# Patient Record
Sex: Female | Born: 1985 | Race: White | Hispanic: No | Marital: Married | Smoking: Never smoker
Health system: Southern US, Community
[De-identification: ages and names within clinical notes are randomized; demographics above are authoritative.]

## PROBLEM LIST (undated history)

## (undated) ENCOUNTER — Inpatient Hospital Stay (HOSPITAL_COMMUNITY): Payer: Self-pay

## (undated) ENCOUNTER — Inpatient Hospital Stay (HOSPITAL_COMMUNITY): Payer: BLUE CROSS/BLUE SHIELD

## (undated) DIAGNOSIS — O039 Complete or unspecified spontaneous abortion without complication: Secondary | ICD-10-CM

## (undated) DIAGNOSIS — O00109 Unspecified tubal pregnancy without intrauterine pregnancy: Secondary | ICD-10-CM

## (undated) DIAGNOSIS — Z789 Other specified health status: Secondary | ICD-10-CM

## (undated) HISTORY — PX: WISDOM TOOTH EXTRACTION: SHX21

---
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
## 2010-01-05 NOTE — L&D Delivery Note (Signed)
Delivery Note At 5:29 PM a viable female was delivered via Vaginal, Spontaneous Delivery (Presentation: Right Occiput Anterior).  APGAR: 8,9 ; weight 6lbs 3oz .   Placenta status: Intact, Spontaneous.  Cord: 3 vessels with the following complications: None.  Anesthesia: Local  Episiotomy: None Lacerations: Labial Suture Repair: none Est. Blood Loss (mL): 400  Mom to postpartum.  Baby to nursery-stable.  Coleby Yett D 08/02/2010, 5:44 PM

## 2010-01-14 LAB — RPR: RPR: NONREACTIVE

## 2010-01-14 LAB — HIV ANTIBODY (ROUTINE TESTING W REFLEX): HIV: NONREACTIVE

## 2010-01-14 LAB — CBC
Hemoglobin: 13.3 g/dL (ref 12.0–16.0)
Platelets: 196 10*3/uL (ref 150–399)

## 2010-01-14 LAB — RUBELLA ANTIBODY, IGM: Rubella: IMMUNE

## 2010-07-15 LAB — STREP B DNA PROBE: GBS: NEGATIVE

## 2010-08-02 ENCOUNTER — Encounter (HOSPITAL_COMMUNITY): Payer: Self-pay | Admitting: *Deleted

## 2010-08-02 ENCOUNTER — Inpatient Hospital Stay (HOSPITAL_COMMUNITY)
Admission: AD | Admit: 2010-08-02 | Discharge: 2010-08-04 | DRG: 373 | Disposition: A | Discharge status: Home / Self Care | Payer: BC Managed Care – PPO | Source: Ambulatory Visit | Attending: Obstetrics and Gynecology | Admitting: Obstetrics and Gynecology

## 2010-08-02 DIAGNOSIS — IMO0001 Reserved for inherently not codable concepts without codable children: Secondary | ICD-10-CM

## 2010-08-02 HISTORY — DX: Other specified health status: Z78.9

## 2010-08-02 LAB — CBC
MCHC: 33.9 g/dL (ref 30.0–36.0)
Platelets: 173 10*3/uL (ref 150–400)
RDW: 13 % (ref 11.5–15.5)
WBC: 14.8 10*3/uL — ABNORMAL HIGH (ref 4.0–10.5)

## 2010-08-02 LAB — ABO/RH: ABO/RH(D): O NEG

## 2010-08-02 MED ORDER — ZOLPIDEM TARTRATE 5 MG PO TABS: 5.0000 mg | ORAL_TABLET | Freq: Every evening | ORAL | Status: DC | PRN

## 2010-08-02 MED ORDER — ONDANSETRON HCL 4 MG/2ML IJ SOLN: 4.0000 mg | Freq: Four times a day (QID) | INTRAMUSCULAR | Status: DC | PRN

## 2010-08-02 MED ORDER — OXYCODONE-ACETAMINOPHEN 5-325 MG PO TABS: 1.0000 | ORAL_TABLET | ORAL | Status: DC | PRN

## 2010-08-02 MED ORDER — LACTATED RINGERS IV SOLN
INTRAVENOUS | Status: DC
  Administered 2010-08-02: 125 mL via INTRAVENOUS
  Administered 2010-08-02: 16:00:00 via INTRAVENOUS

## 2010-08-02 MED ORDER — MEASLES, MUMPS & RUBELLA VAC ~~LOC~~ INJ: 0.5000 mL | INJECTION | Freq: Once | SUBCUTANEOUS | Status: DC

## 2010-08-02 MED ORDER — WITCH HAZEL-GLYCERIN EX PADS: MEDICATED_PAD | CUTANEOUS | Status: DC | PRN

## 2010-08-02 MED ORDER — SENNOSIDES-DOCUSATE SODIUM 8.6-50 MG PO TABS
1.0000 | ORAL_TABLET | Freq: Every day | ORAL | Status: DC
Start: 2010-08-02 — End: 2010-08-04

## 2010-08-02 MED ORDER — ONDANSETRON HCL 4 MG/2ML IJ SOLN: 4.0000 mg | INTRAMUSCULAR | Status: DC | PRN

## 2010-08-02 MED ORDER — MAGNESIUM HYDROXIDE 400 MG/5ML PO SUSP: 30.0000 mL | ORAL | Status: DC | PRN

## 2010-08-02 MED ORDER — NALBUPHINE HCL 10 MG/ML IJ SOLN
5.0000 mg | Freq: Once | INTRAMUSCULAR | Status: DC
  Administered 2010-08-02: 5 mg via SUBCUTANEOUS

## 2010-08-02 MED ORDER — NALBUPHINE SYRINGE 5 MG/0.5 ML
INJECTION | INTRAMUSCULAR | Status: AC
  Filled 2010-08-02: qty 1

## 2010-08-02 MED ORDER — SIMETHICONE 80 MG PO CHEW: 80.0000 mg | CHEWABLE_TABLET | ORAL | Status: DC | PRN

## 2010-08-02 MED ORDER — OXYCODONE-ACETAMINOPHEN 5-325 MG PO TABS: 2.0000 | ORAL_TABLET | ORAL | Status: DC | PRN

## 2010-08-02 MED ORDER — ACETAMINOPHEN 325 MG PO TABS: 650.0000 mg | ORAL_TABLET | ORAL | Status: DC | PRN

## 2010-08-02 MED ORDER — ONDANSETRON HCL 4 MG PO TABS: 4.0000 mg | ORAL_TABLET | ORAL | Status: DC | PRN

## 2010-08-02 MED ORDER — NALBUPHINE HCL 10 MG/ML IJ SOLN
5.0000 mg | Freq: Once | INTRAMUSCULAR | Status: AC
  Administered 2010-08-02: 5 mg via INTRAVENOUS

## 2010-08-02 MED ORDER — PRENATAL PLUS 27-1 MG PO TABS: 1.0000 | ORAL_TABLET | Freq: Every day | ORAL | Status: DC

## 2010-08-02 MED ORDER — BENZOCAINE-MENTHOL 20-0.5 % EX AERO: 1.0000 "application " | INHALATION_SPRAY | CUTANEOUS | Status: DC | PRN

## 2010-08-02 MED ORDER — CITRIC ACID-SODIUM CITRATE 334-500 MG/5ML PO SOLN: 30.0000 mL | ORAL | Status: DC | PRN

## 2010-08-02 MED ORDER — TETANUS-DIPHTH-ACELL PERTUSSIS 5-2.5-18.5 LF-MCG/0.5 IM SUSP
0.5000 mL | Freq: Once | INTRAMUSCULAR | Status: AC
  Administered 2010-08-03: 0.5 mL via INTRAMUSCULAR
  Filled 2010-08-02: qty 0.5

## 2010-08-02 MED ORDER — DIPHENHYDRAMINE HCL 25 MG PO CAPS: 25.0000 mg | ORAL_CAPSULE | Freq: Four times a day (QID) | ORAL | Status: DC | PRN

## 2010-08-02 MED ORDER — IBUPROFEN 600 MG PO TABS
600.0000 mg | ORAL_TABLET | Freq: Four times a day (QID) | ORAL | Status: DC | PRN
  Administered 2010-08-02: 600 mg via ORAL
  Filled 2010-08-02: qty 1

## 2010-08-02 MED ORDER — NALOXONE HCL 0.4 MG/ML IJ SOLN
INTRAMUSCULAR | Status: AC
  Filled 2010-08-02: qty 1

## 2010-08-02 MED ORDER — OXYTOCIN 20 UNITS IN LACTATED RINGERS INFUSION - SIMPLE
125.0000 mL/h | Freq: Once | INTRAVENOUS | Status: DC
  Administered 2010-08-02: 500 mL/h via INTRAVENOUS
  Filled 2010-08-02: qty 1000

## 2010-08-02 MED ORDER — LACTATED RINGERS IV SOLN
500.0000 mL | INTRAVENOUS | Status: DC | PRN
Start: 2010-08-02 — End: 2010-08-02

## 2010-08-02 MED ORDER — LIDOCAINE HCL (PF) 1 % IJ SOLN
30.0000 mL | INTRAMUSCULAR | Status: DC | PRN
  Administered 2010-08-02: 30 mL via SUBCUTANEOUS
  Filled 2010-08-02: qty 30

## 2010-08-02 MED ORDER — IBUPROFEN 600 MG PO TABS
600.0000 mg | ORAL_TABLET | Freq: Four times a day (QID) | ORAL | Status: DC
  Administered 2010-08-03 – 2010-08-04 (×6): 600 mg via ORAL
  Filled 2010-08-02 (×6): qty 1

## 2010-08-02 MED ORDER — PRENATAL PLUS 27-1 MG PO TABS
1.0000 | ORAL_TABLET | Freq: Every day | ORAL | Status: DC
  Administered 2010-08-03 – 2010-08-04 (×2): 1 via ORAL
  Filled 2010-08-02 (×2): qty 1

## 2010-08-02 NOTE — H&P (Signed)
Amy Rios is a 25 y.o. female presenting for evaluation of contractions. Maternal Medical History:  Reason for admission: Reason for admission: contractions.  Contractions: Onset was 3-5 hours ago.   Frequency: regular.   Perceived severity is strong.    Fetal activity: Perceived fetal activity is normal.    Prenatal complications: no prenatal complications   OB History    Grav Para Term Preterm Abortions TAB SAB Ect Mult Living   1              Past Medical History  Diagnosis Date  . No pertinent past medical history    Past Surgical History  Procedure Date  . No past surgeries    Family History: family history includes Cancer in her mother and Hypertension in her maternal grandmother. Social History:  reports that she has never smoked. She has never used smokeless tobacco. She reports that she does not drink alcohol or use illicit drugs.  ROS- normal complaints of pregnancy  Dilation: 10 Effacement (%): 100 Station: +1 Exam by:: Meisigner  Blood pressure 116/72, pulse 73, temperature 97.7 F (36.5 C), temperature source Oral, resp. rate 18, height 5\' 2"  (1.575 m), weight 59.693 kg (131 lb 9.6 oz). Maternal Exam:  Abdomen: Patient reports no abdominal tenderness. Fundal height is appropriate.   Estimated fetal weight is 7 lbs.   Fetal presentation: vertex  Introitus: Normal vulva. Normal vagina.  Amniotic fluid character: clear.  Pelvis: adequate for delivery.   Cervix: Cervix evaluated by digital exam.     Fetal Exam Fetal Monitor Review: Mode: ultrasound.   Variability: moderate (6-25 bpm).   Pattern: accelerations present and variable decelerations.    Fetal State Assessment: Category II - tracings are indeterminate.     Physical Exam  Constitutional: She appears well-developed and well-nourished.  Neck: Neck supple. No thyromegaly present.  Cardiovascular: Normal rate and regular rhythm.   No murmur heard. Respiratory: Effort normal and breath  sounds normal.  GI:       Gravid, appropriate Fundal height, EFW 7 lbs    Prenatal labs: ABO, Rh:  O neg Antibody: Negative (01/10 0000) Rubella:  Immune RPR: Nonreactive (01/10 0000)  HBsAg: Negative (01/10 0000)  HIV: Non-reactive (01/10 0000)  GBS: Negative (07/10 0000)   Assessment/Plan: IUP at 38 weeks in active labor.  Cervix 4 cm on admission, she has progressed without augmentation, AROM clear now with cervix completely dilated.  She has received one dose of Nubain.  Anticipate SVD.   Amy Rios D 08/02/2010, 4:49 PM

## 2010-08-02 NOTE — Progress Notes (Signed)
Pt reports ctx on and off since 0830. Now 2-3 min apart. denies SROM or bleeding reports good fetal movement.

## 2010-08-02 NOTE — Progress Notes (Signed)
Dr. Jackelyn Knife notified of cervical exam. Plan to admit to L/D. Dr. Stann Mainland put admission orders in.

## 2010-08-02 NOTE — Progress Notes (Signed)
Toco tracing is erratic due to maternal positioning & breathing techniques -  On all-fours leaning over head-of-bed

## 2010-08-03 MED ORDER — RHO D IMMUNE GLOBULIN 1500 UNIT/2ML IJ SOLN
300.0000 ug | Freq: Once | INTRAMUSCULAR | Status: AC
  Administered 2010-08-03: 300 ug via INTRAMUSCULAR

## 2010-08-03 NOTE — Consult Note (Signed)
Basic teaching BFW.

## 2010-08-03 NOTE — Progress Notes (Signed)
PPD#1 No problems Afeb, VSS Continue routine postpartum care

## 2010-08-04 LAB — RH IG WORKUP (INCLUDES ABO/RH)
DAT, IgG: NEGATIVE
Fetal Screen: NEGATIVE
Unit division: 0

## 2010-08-04 NOTE — Discharge Summary (Signed)
Obstetric Discharge Summary Reason for Admission: onset of labor Prenatal Procedures: none Intrapartum Procedures: spontaneous vaginal delivery Postpartum Procedures: none Complications-Operative and Postpartum: none  Hemoglobin  Date Value Range Status  08/02/2010 12.8  12.0-15.0 (g/dL) Final     HCT  Date Value Range Status  08/02/2010 37.8  36.0-46.0 (%) Final    Discharge Diagnoses: Term Pregnancy-delivered  Discharge Information: Date: 08/04/2010 Activity: pelvic rest Diet: routine Medications: Ibuprophen Condition: stable Instructions: refer to practice specific booklet Discharge to: home Follow-up Information    Make an appointment in 6 weeks to follow up.         Newborn Data: Live born  Information for the patient's newborn:  LAKYNN, HALVORSEN [[id number]]  female ; APGAR , ; weight ;  Home with mother.  Daviyon Widmayer D 08/04/2010, 8:59 AM

## 2010-08-04 NOTE — Progress Notes (Signed)
  PPD#2 No problems Afeb, VSS Discharge home

## 2010-08-04 NOTE — Progress Notes (Signed)
Reviewed engorgement tx  If needed . Per mom sl sore nipples ,denies breakdown. Reviewed basics . Instructed on use comfort gels and breast shells

## 2010-08-15 ENCOUNTER — Encounter (HOSPITAL_COMMUNITY): Payer: Self-pay | Admitting: *Deleted

## 2012-01-06 NOTE — L&D Delivery Note (Signed)
Delivery Note Pt rapidly progressed to complete dilation and pushed great. At 1:43 PM a healthy female was delivered via Vaginal, Spontaneous Delivery (Presentation:OA).  APGAR: 8,9 ; weight pending .   Placenta status: delivered spontaneously, .  Cord:  with the following complications: none.   Anesthesia: None  Episiotomy: none Lacerations: none Suture Repair: n/a Est. Blood Loss (mL): 200cc  Mom to postpartum.  Baby to stay with mother.. D/w pt and husband circumcision in detail, desire to proceed.  Oliver Pila 09/21/2012, 1:58 PM

## 2012-06-19 ENCOUNTER — Inpatient Hospital Stay (HOSPITAL_COMMUNITY)
Admission: AD | Admit: 2012-06-19 | Discharge: 2012-06-19 | Disposition: A | Discharge status: Home / Self Care | Payer: BC Managed Care – PPO | Source: Ambulatory Visit | Attending: Obstetrics and Gynecology | Admitting: Obstetrics and Gynecology

## 2012-06-19 ENCOUNTER — Encounter (HOSPITAL_COMMUNITY): Payer: Self-pay | Admitting: *Deleted

## 2012-06-19 DIAGNOSIS — O4700 False labor before 37 completed weeks of gestation, unspecified trimester: Secondary | ICD-10-CM | POA: Insufficient documentation

## 2012-06-19 DIAGNOSIS — R42 Dizziness and giddiness: Secondary | ICD-10-CM | POA: Insufficient documentation

## 2012-06-19 DIAGNOSIS — K5289 Other specified noninfective gastroenteritis and colitis: Secondary | ICD-10-CM

## 2012-06-19 DIAGNOSIS — B9789 Other viral agents as the cause of diseases classified elsewhere: Secondary | ICD-10-CM | POA: Insufficient documentation

## 2012-06-19 DIAGNOSIS — O99891 Other specified diseases and conditions complicating pregnancy: Secondary | ICD-10-CM | POA: Insufficient documentation

## 2012-06-19 DIAGNOSIS — K529 Noninfective gastroenteritis and colitis, unspecified: Secondary | ICD-10-CM

## 2012-06-19 DIAGNOSIS — R197 Diarrhea, unspecified: Secondary | ICD-10-CM | POA: Insufficient documentation

## 2012-06-19 HISTORY — DX: Other specified health status: Z78.9

## 2012-06-19 LAB — COMPREHENSIVE METABOLIC PANEL
Albumin: 2.9 g/dL — ABNORMAL LOW (ref 3.5–5.2)
Alkaline Phosphatase: 53 U/L (ref 39–117)
BUN: 9 mg/dL (ref 6–23)
Chloride: 102 mEq/L (ref 96–112)
Creatinine, Ser: 0.4 mg/dL — ABNORMAL LOW (ref 0.50–1.10)
GFR calc Af Amer: >90 mL/min (ref >90)
GFR calc non Af Amer: >90 mL/min (ref >90)
Glucose, Bld: 89 mg/dL (ref 70–99)
Potassium: 3.5 mEq/L (ref 3.5–5.1)
Total Bilirubin: 0.3 mg/dL (ref 0.3–1.2)

## 2012-06-19 LAB — URINALYSIS, ROUTINE W REFLEX MICROSCOPIC
Glucose, UA: NEGATIVE mg/dL
Hgb urine dipstick: NEGATIVE
Ketones, ur: NEGATIVE mg/dL
Leukocytes, UA: NEGATIVE
Protein, ur: NEGATIVE mg/dL
pH: 6 (ref 5.0–8.0)

## 2012-06-19 LAB — CBC
HCT: 34.7 % — ABNORMAL LOW (ref 36.0–46.0)
Hemoglobin: 11.8 g/dL — ABNORMAL LOW (ref 12.0–15.0)
MCV: 89.9 fL (ref 78.0–100.0)
RDW: 13.5 % (ref 11.5–15.5)
WBC: 9.6 10*3/uL (ref 4.0–10.5)

## 2012-06-19 MED ORDER — NIFEDIPINE 10 MG PO CAPS: 10.0000 mg | ORAL_CAPSULE | Freq: Once | ORAL | Status: DC

## 2012-06-19 MED ORDER — LACTATED RINGERS IV BOLUS (SEPSIS)
1000.0000 mL | Freq: Once | INTRAVENOUS | Status: AC
  Administered 2012-06-19: 1000 mL via INTRAVENOUS

## 2012-06-19 MED ORDER — PROMETHAZINE HCL 12.5 MG PO TABS: 12.5000 mg | ORAL_TABLET | Freq: Four times a day (QID) | ORAL | Status: DC | PRN

## 2012-06-19 NOTE — Progress Notes (Signed)
Written and verbal d/c instructions given and understanding voiced. 

## 2012-06-19 NOTE — Progress Notes (Signed)
Ok to d/c transducer per W. Muhammed CNM. Pt feeling better.

## 2012-06-19 NOTE — MAU Provider Note (Signed)
History     CSN: [id number]  Arrival date and time: 06/19/12 0413   First Provider Initiated Contact with Patient 06/19/12 [phone]      Chief Complaint  Patient presents with  . Emesis During Pregnancy  . Dizziness  . Diarrhea   HPI Pt is a G2P1001 here with report of sudden onset of dizziness when sitting up in bed early this am.  The dizziness was then followed by vomiting x 3-4 times along with 3-4 loose stools.  No report of fever, +chills.    Past Medical History  Diagnosis Date  . No pertinent past medical history   . Medical history non-contributory     Past Surgical History  Procedure Laterality Date  . No past surgeries      Family History  Problem Relation Age of Onset  . Cancer Mother   . Hypertension Maternal Grandmother     History  Substance Use Topics  . Smoking status: Never Smoker   . Smokeless tobacco: Never Used  . Alcohol Use: No    Allergies: No Known Allergies  Prescriptions prior to admission  Medication Sig Dispense Refill  . calcium carbonate (TUMS - DOSED IN MG ELEMENTAL CALCIUM) 500 MG chewable tablet Chew 1 tablet by mouth daily as needed. For heartburn       . prenatal vitamin w/FE, FA (PRENATAL 1 + 1) 27-1 MG TABS Take 1 tablet by mouth daily.          Review of Systems  Constitutional: Positive for chills and malaise/fatigue. Negative for fever.  Eyes: Negative for blurred vision and double vision.  Respiratory: Negative for shortness of breath.   Gastrointestinal: Positive for nausea, vomiting, abdominal pain (contractions) and diarrhea. Negative for constipation.  Genitourinary: Negative.   Neurological: Negative for headaches.  All other systems reviewed and are negative.   Physical Exam   Blood pressure 92/56, pulse 76, temperature 97.9 F (36.6 C), resp. rate 20, height 5\' 2"  (1.575 m), weight 53.978 kg (119 lb), SpO2 100.00%, unknown if currently breastfeeding.  Physical Exam  Constitutional: She is oriented to  person, place, and time. She appears well-developed and well-nourished. No distress.  HENT:  Head: Normocephalic.  Neck: Normal range of motion. Neck supple.  Cardiovascular: Normal rate, regular rhythm and normal heart sounds.   Respiratory: Effort normal and breath sounds normal.  GI: Soft. There is no tenderness.  Genitourinary: No bleeding around the vagina. Vaginal discharge (mucusy) found.  Musculoskeletal: Normal range of motion. She exhibits no edema.  Neurological: She is alert and oriented to person, place, and time.  Skin: Skin is dry.  Feels hot to the touch  Cervix - closed/long  MAU Course  Procedures Results for orders placed during the hospital encounter of 06/19/12 (from the past 24 hour(s))  URINALYSIS, ROUTINE W REFLEX MICROSCOPIC     Status: None   Collection Time    06/19/12  4:35 AM      Result Value Range   Color, Urine YELLOW  YELLOW   APPearance CLEAR  CLEAR   Specific Gravity, Urine 1.020  1.005 - 1.030   pH 6.0  5.0 - 8.0   Glucose, UA NEGATIVE  NEGATIVE mg/dL   Hgb urine dipstick NEGATIVE  NEGATIVE   Bilirubin Urine NEGATIVE  NEGATIVE   Ketones, ur NEGATIVE  NEGATIVE mg/dL   Protein, ur NEGATIVE  NEGATIVE mg/dL   Urobilinogen, UA 0.2  0.0 - 1.0 mg/dL   Nitrite NEGATIVE  NEGATIVE   Leukocytes, UA NEGATIVE  NEGATIVE  CBC     Status: Abnormal   Collection Time    06/19/12  6:00 AM      Result Value Range   WBC 9.6  4.0 - 10.5 K/uL   RBC 3.86 (*) 3.87 - 5.11 MIL/uL   Hemoglobin 11.8 (*) 12.0 - 15.0 g/dL   HCT 16.1 (*) 09.6 - 04.5 %   MCV 89.9  78.0 - 100.0 fL   MCH 30.6  26.0 - 34.0 pg   MCHC 34.0  30.0 - 36.0 g/dL   RDW 40.9  81.1 - 91.4 %   Platelets 166  150 - 400 K/uL  COMPREHENSIVE METABOLIC PANEL     Status: Abnormal   Collection Time    06/19/12  6:00 AM      Result Value Range   Sodium 135  135 - 145 mEq/L   Potassium 3.5  3.5 - 5.1 mEq/L   Chloride 102  96 - 112 mEq/L   CO2 24  19 - 32 mEq/L   Glucose, Bld 89  70 - 99 mg/dL   BUN  9  6 - 23 mg/dL   Creatinine, Ser 7.82 (*) 0.50 - 1.10 mg/dL   Calcium 8.6  8.4 - 95.6 mg/dL   Total Protein 5.9 (*) 6.0 - 8.3 g/dL   Albumin 2.9 (*) 3.5 - 5.2 g/dL   AST 19  0 - 37 U/L   ALT 12  0 - 35 U/L   Alkaline Phosphatase 53  39 - 117 U/L   Total Bilirubin 0.3  0.3 - 1.2 mg/dL   GFR calc non Af Amer >90  >90 mL/min   GFR calc Af Amer >90  >90 mL/min    0540 LR 1000L Bolus 0620 Dr. Ellyn Hack called and informed regarding pt HPI/exam/vitals/contractions > agrees with plan for IV bolus; CBC/CMP pending; may give procardia if desired or wait for effects of IV bolus. 0622 PO Procardia ordered 0630 Pt reports contractions decreasing and dizziness starting to resolve > defer procardia and reassess after liter of fluid finished infusing.   64 Consulted with Dr. Ellyn Hack > pt reports improvement in symptoms, contractions resolved > DC home with RX for anti-emetic  Assessment and Plan  Viral Illness in Pregnancy  Plan: DC to home RX Phenergan Schedule follow-up in office if no improvement of worsening of symptoms.  [facility] 06/19/2012, 5:30 AM

## 2012-06-19 NOTE — MAU Note (Signed)
Woke up several times this morning and have been dizzy. About 0230 awoke and room spinning. I have thrown up 3-4times. Diarrhea x 3 this am

## 2012-08-25 ENCOUNTER — Inpatient Hospital Stay (HOSPITAL_COMMUNITY)
Admission: AD | Admit: 2012-08-25 | Discharge: 2012-08-26 | DRG: 379 | Disposition: A | Discharge status: Home / Self Care | Payer: BC Managed Care – PPO | Source: Ambulatory Visit | Attending: Obstetrics and Gynecology | Admitting: Obstetrics and Gynecology

## 2012-08-25 ENCOUNTER — Encounter (HOSPITAL_COMMUNITY): Payer: Self-pay | Admitting: *Deleted

## 2012-08-25 DIAGNOSIS — O360990 Maternal care for other rhesus isoimmunization, unspecified trimester, not applicable or unspecified: Secondary | ICD-10-CM | POA: Diagnosis present

## 2012-08-25 DIAGNOSIS — O4700 False labor before 37 completed weeks of gestation, unspecified trimester: Principal | ICD-10-CM | POA: Diagnosis present

## 2012-08-25 LAB — URINE MICROSCOPIC-ADD ON

## 2012-08-25 LAB — DIFFERENTIAL
Eosinophils Relative: 1 % (ref 0–5)
Lymphocytes Relative: 10 % — ABNORMAL LOW (ref 12–46)
Lymphs Abs: 1.3 10*3/uL (ref 0.7–4.0)
Monocytes Absolute: 0.6 10*3/uL (ref 0.1–1.0)
Neutro Abs: 10.8 10*3/uL — ABNORMAL HIGH (ref 1.7–7.7)

## 2012-08-25 LAB — URINALYSIS, ROUTINE W REFLEX MICROSCOPIC
Ketones, ur: NEGATIVE mg/dL
Nitrite: NEGATIVE
Protein, ur: NEGATIVE mg/dL
Urobilinogen, UA: 0.2 mg/dL (ref 0.0–1.0)

## 2012-08-25 LAB — CBC
HCT: 37.3 % (ref 36.0–46.0)
Hemoglobin: 12.4 g/dL (ref 12.0–15.0)
MCV: 91 fL (ref 78.0–100.0)
RBC: 4.1 MIL/uL (ref 3.87–5.11)
WBC: 12.7 10*3/uL — ABNORMAL HIGH (ref 4.0–10.5)

## 2012-08-25 MED ORDER — BETAMETHASONE SOD PHOS & ACET 6 (3-3) MG/ML IJ SUSP
12.0000 mg | Freq: Once | INTRAMUSCULAR | Status: DC
  Filled 2012-08-25: qty 2

## 2012-08-25 MED ORDER — LACTATED RINGERS IV SOLN
INTRAVENOUS | Status: DC
  Administered 2012-08-25 – 2012-08-26 (×3): via INTRAVENOUS

## 2012-08-25 MED ORDER — CITRIC ACID-SODIUM CITRATE 334-500 MG/5ML PO SOLN
ORAL | Status: AC
  Filled 2012-08-25: qty 15

## 2012-08-25 MED ORDER — DOCUSATE SODIUM 100 MG PO CAPS: 100.0000 mg | ORAL_CAPSULE | Freq: Every day | ORAL | Status: DC

## 2012-08-25 MED ORDER — ACETAMINOPHEN 325 MG PO TABS: 650.0000 mg | ORAL_TABLET | ORAL | Status: DC | PRN

## 2012-08-25 MED ORDER — MAGNESIUM SULFATE BOLUS VIA INFUSION
4.0000 g | Freq: Once | INTRAVENOUS | Status: AC
  Administered 2012-08-25: 4 g via INTRAVENOUS
  Filled 2012-08-25: qty 500

## 2012-08-25 MED ORDER — NIFEDIPINE 10 MG PO CAPS
ORAL_CAPSULE | ORAL | Status: AC
  Administered 2012-08-25: 10 mg via ORAL
  Filled 2012-08-25: qty 1

## 2012-08-25 MED ORDER — SODIUM CHLORIDE 0.9 % IV SOLN
2.0000 g | Freq: Four times a day (QID) | INTRAVENOUS | Status: DC
  Administered 2012-08-25 – 2012-08-26 (×3): 2 g via INTRAVENOUS
  Filled 2012-08-25 (×7): qty 2000

## 2012-08-25 MED ORDER — TERBUTALINE SULFATE 1 MG/ML IJ SOLN
0.2500 mg | Freq: Once | INTRAMUSCULAR | Status: AC
  Administered 2012-08-25: 0.25 mg via SUBCUTANEOUS

## 2012-08-25 MED ORDER — TERBUTALINE SULFATE 1 MG/ML IJ SOLN
0.2500 mg | Freq: Once | INTRAMUSCULAR | Status: DC
  Administered 2012-08-25: 0.25 mg via SUBCUTANEOUS

## 2012-08-25 MED ORDER — TERBUTALINE SULFATE 1 MG/ML IJ SOLN
INTRAMUSCULAR | Status: AC
  Administered 2012-08-25: 0.25 mg via SUBCUTANEOUS
  Filled 2012-08-25: qty 1

## 2012-08-25 MED ORDER — MAGNESIUM SULFATE 40 G IN LACTATED RINGERS - SIMPLE
2.0000 g/h | Freq: Once | INTRAVENOUS | Status: DC
  Filled 2012-08-25: qty 500

## 2012-08-25 MED ORDER — MAGNESIUM SULFATE 40 G IN LACTATED RINGERS - SIMPLE
2.0000 g/h | INTRAVENOUS | Status: DC
  Filled 2012-08-25: qty 500

## 2012-08-25 MED ORDER — ZOLPIDEM TARTRATE 5 MG PO TABS
5.0000 mg | ORAL_TABLET | Freq: Every evening | ORAL | Status: DC | PRN
  Administered 2012-08-25: 5 mg via ORAL
  Filled 2012-08-25: qty 1

## 2012-08-25 MED ORDER — NIFEDIPINE 10 MG PO CAPS
10.0000 mg | ORAL_CAPSULE | Freq: Once | ORAL | Status: AC
  Administered 2012-08-25: 10 mg via ORAL

## 2012-08-25 MED ORDER — BETAMETHASONE SOD PHOS & ACET 6 (3-3) MG/ML IJ SUSP
12.0000 mg | Freq: Once | INTRAMUSCULAR | Status: AC
  Administered 2012-08-25: 12 mg via INTRAMUSCULAR
  Filled 2012-08-25: qty 2

## 2012-08-25 MED ORDER — LACTATED RINGERS IV BOLUS (SEPSIS)
1000.0000 mL | Freq: Once | INTRAVENOUS | Status: AC
  Administered 2012-08-25: 1000 mL via INTRAVENOUS

## 2012-08-25 MED ORDER — PRENATAL MULTIVITAMIN CH
1.0000 | ORAL_TABLET | Freq: Every day | ORAL | Status: DC
  Administered 2012-08-25: 1 via ORAL
  Filled 2012-08-25: qty 1

## 2012-08-25 MED ORDER — MAGNESIUM SULFATE 40 G IN LACTATED RINGERS - SIMPLE
2.0000 g/h | INTRAVENOUS | Status: AC
  Filled 2012-08-25: qty 500

## 2012-08-25 MED ORDER — BETAMETHASONE SOD PHOS & ACET 6 (3-3) MG/ML IJ SUSP
12.0000 mg | Freq: Once | INTRAMUSCULAR | Status: AC
  Administered 2012-08-26: 12 mg via INTRAMUSCULAR
  Filled 2012-08-25: qty 2

## 2012-08-25 MED ORDER — CALCIUM CARBONATE ANTACID 500 MG PO CHEW: 2.0000 | CHEWABLE_TABLET | ORAL | Status: DC | PRN

## 2012-08-25 MED ORDER — ONDANSETRON HCL 4 MG/2ML IJ SOLN
4.0000 mg | Freq: Once | INTRAMUSCULAR | Status: AC | PRN
  Administered 2012-08-25: 4 mg via INTRAVENOUS
  Filled 2012-08-25: qty 2

## 2012-08-25 NOTE — Progress Notes (Signed)
Patient ID: Amy Rios, female   DOB: [birth year], 27 y.o.   MRN: [id number] Contractions have definitely spaced out with the magnesium, but not completely resolved Pt has some back pain FHR reassuring Will continue magnesium until AM then likely d/c and see if contractions resume

## 2012-08-25 NOTE — Consult Note (Signed)
Neonatology Consult to Antenatal Patient:  I was asked by Dr. Senaida Ores to see this patient in order to provide antenatal counseling due to preterm labor at 33 6/7 weeks.  Ms. Wyble is admitted today at 55 6/[redacted] weeks GA. She is continued to have contractions despite getting Terbutaline, Procardia, and IV fluids in MAU. She is now getting BMZ, Magnesium sulfate, and IV Ampicillin. Her contractions have stopped now,  I spoke with the patient, her husband, and 2 visitors. We discussed the worst case of delivery in the next 1-2 days, including usual DR management, possible respiratory complications and need for support, IV access, feedings (mother desires breast feeding), LOS, Mortality and Morbidity, and long term outcomes. They had a few questions, which I answered. I offered a NICU tour to any interested family members and would be glad to come back if they has more questions later.  Thank you for asking me to see this patient.  Doretha Sou, MD Neonatologist  The total length of face-to-face or floor/unit time for this encounter was 20 minutes. Counseling and/or coordination of care was 15 minutes of the above.

## 2012-08-25 NOTE — Progress Notes (Signed)
Patient ID: Amy Rios, female   DOB: [birth year], 27 y.o.   MRN: [id number] Pt comfortable and contractions have basically stopped.  Afeb vss FHR reassuring  Will d/c magnesium in AM and see how pt responds Will give second betamethasone at 8am If begins contracting again, will try procardia

## 2012-08-25 NOTE — Progress Notes (Signed)
Neo consult called, will be down this evening to see pt.

## 2012-08-25 NOTE — Discharge Summary (Signed)
Physician Discharge Summary  Patient ID: Amy Rios MRN: [id number] DOB/AGE: 1985-12-23 26 y.o.  Admit date: 08/25/2012 Discharge date: 08/25/2012  Admission Diagnoses: Preterm pregnancy at 33 6/7 weeks                                         Preterm labor  Discharge Diagnoses:  Active Problems:   Preterm labor   Discharged Condition: good  Hospital Course: Pt was admitted with contractions and cervical dilation to 3-4 cm.  Tocolysis was attempted with terbutaline, IVF and procardia, but were not successful so she was placed on magnesium IV. She received a course of betamethasone x 2.    Treatments: IV hydration, tocolysis with magnesium  Discharge Exam: Blood pressure 116/63, pulse 85, temperature 98.1 F (36.7 C), temperature source Oral, resp. rate 18, height 5\' 2"  (1.575 m), weight 55.974 kg (123 lb 6.4 oz), last menstrual period 01/01/2012, SpO2 99.00%. General appearance: alert and cooperative GI: soft, gravid NT   Disposition: 01-Home or Self Care     Medication List    ASK your doctor about these medications       prenatal multivitamin Tabs tablet  Take 1 tablet by mouth daily at 12 noon.         SignedOliver Pila 08/25/2012, 11:05 PM

## 2012-08-25 NOTE — MAU Note (Signed)
Patient states she has been having contractions every 3-5 minutes, not as intense at this time as earlier this am. Denies bleeding or leaking and reports good fetal movement.

## 2012-08-25 NOTE — MAU Provider Note (Signed)
  History     CSN: [id number]  Arrival date and time: 08/25/12 4098   First Provider Initiated Contact with Patient 08/25/12 0827      Chief Complaint  Patient presents with  . Labor Eval   HPI  RN note Patient states she has been having contractions every 3-5 minutes, not as intense at this time as earlier this am. Denies bleeding or leaking and reports good fetal movement.     Pt had intercourse last night.  Pt has not had any complications with this pregnancy.  Pt delivered early with last pregnancy at 36 weeks.  Past Medical History  Diagnosis Date  . No pertinent past medical history   . Medical history non-contributory     Past Surgical History  Procedure Laterality Date  . No past surgeries      Family History  Problem Relation Age of Onset  . Cancer Mother   . Hypertension Maternal Grandmother     History  Substance Use Topics  . Smoking status: Never Smoker   . Smokeless tobacco: Never Used  . Alcohol Use: No    Allergies: No Known Allergies  Prescriptions prior to admission  Medication Sig Dispense Refill  . Prenatal Vit-Fe Fumarate-FA (PRENATAL MULTIVITAMIN) TABS tablet Take 1 tablet by mouth daily at 12 noon.        Review of Systems  Constitutional: Negative for fever and chills.  Gastrointestinal: Positive for abdominal pain. Negative for nausea, vomiting, diarrhea and constipation.  Genitourinary: Negative for dysuria.  Neurological: Negative for headaches.   Physical Exam   Blood pressure 105/55, pulse 106, temperature 98 F (36.7 C), temperature source Oral, resp. rate 18, height 5\' 2"  (1.575 m), weight 55.974 kg (123 lb 6.4 oz).  Physical Exam  Nursing note and vitals reviewed. Constitutional: She appears well-developed and well-nourished. No distress.  HENT:  Head: Normocephalic.  Eyes: Pupils are equal, round, and reactive to light.  Cardiovascular: Normal rate.   Respiratory: Effort normal.  GI: Soft. She exhibits no  distension. There is no tenderness. There is no rebound.  Ctx q 2 to 3 minutes; FHR 140 bpm baseline, reactive  Genitourinary:  Cervix 3 cm 90%effaced -1station  Musculoskeletal: Normal range of motion.  Neurological: She is alert.  Skin: Skin is warm and dry.  Psychiatric: She has a normal mood and affect.    MAU Course  Procedures Dr. Senaida Ores informed of pt's status and ctx/dilation. Terbutaline 0.025mg  SQ given; pt's ctx diminished to 4-5 minutes Terbutaline repeated Dr. Senaida Ores here to see pt    Assessment and Plan    Amy Rios 08/25/2012, 9:06 AM

## 2012-08-25 NOTE — H&P (Signed)
Amy Rios is a 27 y.o. female G2P1001 at 21 6/7 weeks (EDD10/3/14 by LMP c/w 9 week [place]) presented to MAU for persistent contractions every 5 minutes for several hours and was found to be 3-4 cm dilated.  SHe was given IVF, terbutaline, and procardia, with some improvement, but then the contractions resumed and felt strong again.  Her prenatal care has been uncomplicated to this point.  She is Rh negative and received rhogam.  Maternal Medical History:  Reason for admission: Contractions.   Contractions: Onset was 6-12 hours ago.   Frequency: regular.   Perceived severity is moderate.    Fetal activity: Perceived fetal activity is normal.    Prenatal Complications - Diabetes: none.    OB History   Grav Para Term Preterm Abortions TAB SAB Ect Mult Living   2 1 1       1     2012 NSVD 6#3oz  Past Medical History  Diagnosis Date  . No pertinent past medical history   . Medical history non-contributory    Past Surgical History  Procedure Laterality Date  . No past surgeries     Family History: family history includes Cancer in her mother; Hypertension in her maternal grandmother. Social History:  reports that she has never smoked. She has never used smokeless tobacco. She reports that she does not drink alcohol or use illicit drugs.   Prenatal Transfer Tool  Maternal Diabetes: No Genetic Screening: Declined Maternal Ultrasounds/Referrals: Normal Fetal Ultrasounds or other Referrals:  None Maternal Substance Abuse:  No Significant Maternal Medications:  None Significant Maternal Lab Results:  None Other Comments:  None  ROS  Dilation: 3.5 Effacement (%): 80 Station: -1 Exam by:: Dr Senaida Ores  Blood pressure 103/64, pulse 82, temperature 98.8 F (37.1 C), temperature source Oral, resp. rate 16, height 5\' 2"  (1.575 m), weight 55.974 kg (123 lb 6.4 oz), last menstrual period 01/01/2012, SpO2 98.00%. Maternal Exam:  Uterine Assessment: Contraction strength is moderate.   Contraction frequency is regular.   Abdomen: Patient reports no abdominal tenderness. Fetal presentation: vertex  Introitus: Normal vulva. Normal vagina.    Physical Exam  Constitutional: She is oriented to person, place, and time. She appears well-developed and well-nourished.  Cardiovascular: Normal rate and regular rhythm.   Respiratory: Effort normal and breath sounds normal.  GI: Soft.  Genitourinary: Vagina normal and uterus normal.  Neurological: She is alert and oriented to person, place, and time.  Psychiatric: She has a normal mood and affect. Her behavior is normal.    Prenatal labs: ABO, Rh:  O negative Antibody:  Positive Rubella:  Immune RPR:   Neg HBsAg:   Neg HIV:   Neg GBS:   unknown Declined genetics One hour glucola elevated 3 hour GTT WNL  Assessment/Plan: Pt admitted for magnesium sulfate tocolysis and given beta methasone x 1.  Contractions currently improved but still present.  Cervix reexamined and not much change noted.  Will continue the tocolysis and follow closely.  Will start GBS prophylaxis in casae breaks through as has h/o rapid labor.   Oliver Pila 08/25/2012, 3:02 PM

## 2012-08-25 NOTE — MAU Note (Signed)
Chris, CN in NICU notified of patient status.

## 2012-08-26 LAB — URINE CULTURE

## 2012-08-26 LAB — RPR: RPR Ser Ql: NONREACTIVE

## 2012-08-26 MED ORDER — NIFEDIPINE 20 MG PO CAPS: 20.0000 mg | ORAL_CAPSULE | Freq: Four times a day (QID) | ORAL | Status: DC

## 2012-08-26 MED ORDER — NIFEDIPINE 10 MG PO CAPS
20.0000 mg | ORAL_CAPSULE | Freq: Four times a day (QID) | ORAL | Status: DC
  Administered 2012-08-26: 20 mg via ORAL
  Filled 2012-08-26: qty 2

## 2012-08-26 MED ORDER — PROMETHAZINE HCL 25 MG/ML IJ SOLN
INTRAMUSCULAR | Status: AC
  Filled 2012-08-26: qty 1

## 2012-08-26 NOTE — Progress Notes (Signed)
Patient ID: Amy Rios, female   DOB: 31-Jul-1985, 27 y.o.   MRN: [id number] Minimal contractions off magnesium] Feels ready for d/c Will d/c home on procardia 20mg  po q 6hr Will call for recurrent  Contractions  Has appt next week in office

## 2012-09-21 ENCOUNTER — Encounter (HOSPITAL_COMMUNITY): Payer: Self-pay | Admitting: *Deleted

## 2012-09-21 ENCOUNTER — Inpatient Hospital Stay (HOSPITAL_COMMUNITY)
Admission: AD | Admit: 2012-09-21 | Discharge: 2012-09-22 | DRG: 373 | Disposition: A | Discharge status: Home / Self Care | Payer: BC Managed Care – PPO | Source: Ambulatory Visit | Attending: Obstetrics and Gynecology | Admitting: Obstetrics and Gynecology

## 2012-09-21 LAB — RPR: RPR Ser Ql: NONREACTIVE

## 2012-09-21 LAB — CBC
HCT: 40.6 % (ref 36.0–46.0)
Hemoglobin: 13.5 g/dL (ref 12.0–15.0)
WBC: 11.3 10*3/uL — ABNORMAL HIGH (ref 4.0–10.5)

## 2012-09-21 MED ORDER — TETANUS-DIPHTH-ACELL PERTUSSIS 5-2.5-18.5 LF-MCG/0.5 IM SUSP
0.5000 mL | Freq: Once | INTRAMUSCULAR | Status: AC
  Administered 2012-09-22: 0.5 mL via INTRAMUSCULAR
  Filled 2012-09-21: qty 0.5

## 2012-09-21 MED ORDER — OXYTOCIN BOLUS FROM INFUSION: 500.0000 mL | INTRAVENOUS | Status: DC

## 2012-09-21 MED ORDER — BENZOCAINE-MENTHOL 20-0.5 % EX AERO: 1.0000 "application " | INHALATION_SPRAY | CUTANEOUS | Status: DC | PRN

## 2012-09-21 MED ORDER — LACTATED RINGERS IV SOLN: 500.0000 mL | INTRAVENOUS | Status: DC | PRN

## 2012-09-21 MED ORDER — ONDANSETRON HCL 4 MG PO TABS: 4.0000 mg | ORAL_TABLET | ORAL | Status: DC | PRN

## 2012-09-21 MED ORDER — BUTORPHANOL TARTRATE 1 MG/ML IJ SOLN
1.0000 mg | INTRAMUSCULAR | Status: DC | PRN
  Administered 2012-09-21: 1 mg via INTRAVENOUS
  Filled 2012-09-21: qty 1

## 2012-09-21 MED ORDER — WITCH HAZEL-GLYCERIN EX PADS: 1.0000 "application " | MEDICATED_PAD | CUTANEOUS | Status: DC | PRN

## 2012-09-21 MED ORDER — OXYTOCIN 40 UNITS IN LACTATED RINGERS INFUSION - SIMPLE MED
62.5000 mL/h | INTRAVENOUS | Status: DC
  Administered 2012-09-21: 999 mL/h via INTRAVENOUS
  Filled 2012-09-21: qty 1000

## 2012-09-21 MED ORDER — OXYCODONE-ACETAMINOPHEN 5-325 MG PO TABS: 1.0000 | ORAL_TABLET | ORAL | Status: DC | PRN

## 2012-09-21 MED ORDER — LACTATED RINGERS IV SOLN
INTRAVENOUS | Status: DC
  Administered 2012-09-21: 12:00:00 via INTRAVENOUS

## 2012-09-21 MED ORDER — LANOLIN HYDROUS EX OINT: TOPICAL_OINTMENT | CUTANEOUS | Status: DC | PRN

## 2012-09-21 MED ORDER — DIPHENHYDRAMINE HCL 25 MG PO CAPS: 25.0000 mg | ORAL_CAPSULE | Freq: Four times a day (QID) | ORAL | Status: DC | PRN

## 2012-09-21 MED ORDER — IBUPROFEN 600 MG PO TABS
600.0000 mg | ORAL_TABLET | Freq: Four times a day (QID) | ORAL | Status: DC
  Administered 2012-09-21 – 2012-09-22 (×3): 600 mg via ORAL
  Filled 2012-09-21 (×3): qty 1

## 2012-09-21 MED ORDER — SIMETHICONE 80 MG PO CHEW: 80.0000 mg | CHEWABLE_TABLET | ORAL | Status: DC | PRN

## 2012-09-21 MED ORDER — ONDANSETRON HCL 4 MG/2ML IJ SOLN: 4.0000 mg | Freq: Four times a day (QID) | INTRAMUSCULAR | Status: DC | PRN

## 2012-09-21 MED ORDER — IBUPROFEN 600 MG PO TABS
600.0000 mg | ORAL_TABLET | Freq: Four times a day (QID) | ORAL | Status: DC | PRN
  Administered 2012-09-21: 600 mg via ORAL
  Filled 2012-09-21 (×2): qty 1

## 2012-09-21 MED ORDER — ACETAMINOPHEN 325 MG PO TABS: 650.0000 mg | ORAL_TABLET | ORAL | Status: DC | PRN

## 2012-09-21 MED ORDER — CITRIC ACID-SODIUM CITRATE 334-500 MG/5ML PO SOLN: 30.0000 mL | ORAL | Status: DC | PRN

## 2012-09-21 MED ORDER — ZOLPIDEM TARTRATE 5 MG PO TABS: 5.0000 mg | ORAL_TABLET | Freq: Every evening | ORAL | Status: DC | PRN

## 2012-09-21 MED ORDER — INFLUENZA VAC SPLIT QUAD 0.5 ML IM SUSP
0.5000 mL | INTRAMUSCULAR | Status: AC
  Administered 2012-09-22: 0.5 mL via INTRAMUSCULAR
  Filled 2012-09-21: qty 0.5

## 2012-09-21 MED ORDER — ONDANSETRON HCL 4 MG/2ML IJ SOLN: 4.0000 mg | INTRAMUSCULAR | Status: DC | PRN

## 2012-09-21 MED ORDER — LIDOCAINE HCL (PF) 1 % IJ SOLN
30.0000 mL | INTRAMUSCULAR | Status: DC | PRN
  Filled 2012-09-21 (×2): qty 30

## 2012-09-21 MED ORDER — SENNOSIDES-DOCUSATE SODIUM 8.6-50 MG PO TABS: 2.0000 | ORAL_TABLET | ORAL | Status: DC

## 2012-09-21 MED ORDER — PRENATAL MULTIVITAMIN CH
1.0000 | ORAL_TABLET | Freq: Every day | ORAL | Status: DC
  Administered 2012-09-22: 1 via ORAL
  Filled 2012-09-21: qty 1

## 2012-09-21 MED ORDER — DIBUCAINE 1 % RE OINT: 1.0000 "application " | TOPICAL_OINTMENT | RECTAL | Status: DC | PRN

## 2012-09-21 MED ORDER — NALOXONE HCL 0.4 MG/ML IJ SOLN
INTRAMUSCULAR | Status: AC
  Filled 2012-09-21: qty 1

## 2012-09-21 NOTE — H&P (Signed)
Amy Rios is a 27 y.o. female G2P1001 at 37+ weeks (EDD 10/07/12 by LMP c/w 9 week [place]) presenting for labor with cervical change from 5cm to 7cm.  Prenatal care complicated by preterm labor at 33 weeks stopped with magnesium, and received betamethasone then.  No other issues.  Maternal Medical History:  Reason for admission: Contractions.   Contractions: Onset was 3-5 hours ago.   Frequency: regular.   Perceived severity is strong.    Fetal activity: Perceived fetal activity is normal.    Prenatal Complications - Diabetes: none.    OB History   Grav Para Term Preterm Abortions TAB SAB Ect Mult Living   2 1 1       1     2012 NSVD 6#3oz  Past Medical History  Diagnosis Date  . No pertinent past medical history   . Medical history non-contributory    Past Surgical History  Procedure Laterality Date  . Wisdom tooth extraction     Family History: family history includes Cancer in her mother; Hypertension in her maternal grandmother. Social History:  reports that she has never smoked. She has never used smokeless tobacco. She reports that she does not drink alcohol or use illicit drugs.   Prenatal Transfer Tool  Maternal Diabetes: No Genetic Screening: Declined Maternal Ultrasounds/Referrals: Normal Fetal Ultrasounds or other Referrals:  None Maternal Substance Abuse:  No Significant Maternal Medications:  None Significant Maternal Lab Results:  Lab values include: Rh negative Other Comments:  None  ROS  Dilation: 7 Effacement (%): 90 Station: -1 Exam by:: Suede Greenawalt MD AROM clear  Blood pressure 116/72, pulse 86, temperature 97.7 F (36.5 C), temperature source Oral, resp. rate 18, height 5' 1.5" (1.562 m), weight 56.881 kg (125 lb 6.4 oz), last menstrual period 01/01/2012. Maternal Exam:  Uterine Assessment: Contraction strength is firm.  Contraction frequency is regular.   Abdomen: Patient reports no abdominal tenderness. Fetal presentation:  vertex  Introitus: Normal vulva. Normal vagina.    Physical Exam  Constitutional: She appears well-developed and well-nourished.  Cardiovascular: Normal rate and regular rhythm.   Respiratory: Effort normal and breath sounds normal.  GI: Soft.  Genitourinary: Vagina normal and uterus normal.  Neurological: She is alert.  Psychiatric: She has a normal mood and affect.    Prenatal labs: ABO, Rh:  O negative Antibody:  negative Rubella: 1.97 (08/21 0845) Immune RPR: NON REACTIVE (08/21 0845)  HBsAg: NEGATIVE (08/21 0845)  HIV:   NR GBS:   Negative Elevated one hour GCT Three hour GTT WNL Declined genetics  Assessment/Plan: Pt presenting in active labor and coping well. May use stadol, but declines epidural.  Following progress.   Oliver Pila 09/21/2012, 1:19 PM

## 2012-09-21 NOTE — MAU Note (Signed)
Patient state she has been having contractions every 3 minutes. Denies bleeding or leaking and reports good fetal movement.

## 2012-09-22 LAB — CBC
Hemoglobin: 10.8 g/dL — ABNORMAL LOW (ref 12.0–15.0)
RBC: 3.53 MIL/uL — ABNORMAL LOW (ref 3.87–5.11)

## 2012-09-22 MED ORDER — IBUPROFEN 600 MG PO TABS: 600.0000 mg | ORAL_TABLET | Freq: Four times a day (QID) | ORAL | Status: DC

## 2012-09-22 NOTE — Progress Notes (Signed)
Post Partum Day 1 Subjective: no complaints, up ad lib and tolerating PO, requests early d/c  Objective: Blood pressure 99/59, pulse 77, temperature 98.6 F (37 C), temperature source Oral, resp. rate 18, height 5' 1.5" (1.562 m), weight 56.881 kg (125 lb 6.4 oz), last menstrual period 01/01/2012, unknown if currently breastfeeding.  Physical Exam:  General: alert and cooperative Lochia: appropriate Uterine Fundus: firm    Recent Labs  09/21/12 1135 09/22/12 0555  HGB 13.5 10.8*  HCT 40.6 31.8*    Assessment/Plan: Discharge home   LOS: 1 day   Maham Quintin W 09/22/2012, 8:58 AM

## 2012-09-22 NOTE — Discharge Summary (Signed)
Obstetric Discharge Summary Reason for Admission: onset of labor Prenatal Procedures: none Intrapartum Procedures: spontaneous vaginal delivery Postpartum Procedures: none Complications-Operative and Postpartum: none Hemoglobin  Date Value Range Status  09/22/2012 10.8* 12.0 - 15.0 g/dL Final     DELTA CHECK NOTED     REPEATED TO VERIFY     HCT  Date Value Range Status  09/22/2012 31.8* 36.0 - 46.0 % Final    Physical Exam:  General: alert and cooperative Lochia: appropriate Uterine Fundus: firm   Discharge Diagnoses: Term Pregnancy-delivered  Discharge Information: Date: 09/22/2012 Activity: pelvic rest Diet: routine Medications: Ibuprofen Condition: improved Instructions: refer to practice specific booklet Discharge to: home Follow-up Information   Follow up with Oliver Pila, MD. Schedule an appointment as soon as possible for a visit in 6 weeks. (postpartum check)    Specialty:  Obstetrics and Gynecology   Contact information:   [address], [address] [place] [address] [phone]       Newborn Data: Live born female  Birth Weight: 6 lb 6.7 oz (2912 g) APGAR: 8, 9  Home with mother.  Oliver Pila 09/22/2012, 9:01 AM

## 2013-11-06 ENCOUNTER — Encounter (HOSPITAL_COMMUNITY): Payer: Self-pay | Admitting: *Deleted

## 2014-01-05 NOTE — L&D Delivery Note (Signed)
Delivery Note I was called for delivery at 405 pm, I arrived in room with pt in hands/knees position and baby crowning.  OB fellow at bedside and gloved and baby delivered in one push, I then assumed care.   At 4:11 PM a healthy female was delivered via  (Presentation: OA).  APGAR:8 ,9 ; weight pending .   Placenta status: delivered spontaneously.  Cord:  with the following complications: none.   Anesthesia:  none Episiotomy:  none Lacerations:  none Suture Repair: n/a Est. Blood Loss (mL):   Mom to postpartum.  Baby to Couplet care / Skin to Skin. D/w pt and husband circumcision and they desire to proceed  Neylan Koroma W 09/19/2014, 4:32 PM

## 2014-06-10 ENCOUNTER — Encounter (HOSPITAL_COMMUNITY): Payer: Self-pay | Admitting: *Deleted

## 2014-06-10 ENCOUNTER — Inpatient Hospital Stay (HOSPITAL_COMMUNITY)
Admission: AD | Admit: 2014-06-10 | Discharge: 2014-06-10 | Disposition: A | Discharge status: Home / Self Care | Payer: BLUE CROSS/BLUE SHIELD | Source: Ambulatory Visit | Attending: Obstetrics and Gynecology | Admitting: Obstetrics and Gynecology

## 2014-06-10 DIAGNOSIS — Z3A24 24 weeks gestation of pregnancy: Secondary | ICD-10-CM | POA: Diagnosis not present

## 2014-06-10 DIAGNOSIS — O4702 False labor before 37 completed weeks of gestation, second trimester: Secondary | ICD-10-CM

## 2014-06-10 LAB — URINALYSIS, ROUTINE W REFLEX MICROSCOPIC
BILIRUBIN URINE: NEGATIVE
Glucose, UA: NEGATIVE mg/dL
Hgb urine dipstick: NEGATIVE
Ketones, ur: 40 mg/dL — AB
NITRITE: NEGATIVE
PROTEIN: NEGATIVE mg/dL
SPECIFIC GRAVITY, URINE: 1.02 (ref 1.005–1.030)
UROBILINOGEN UA: 0.2 mg/dL (ref 0.0–1.0)
pH: 6 (ref 5.0–8.0)

## 2014-06-10 LAB — URINE MICROSCOPIC-ADD ON

## 2014-06-10 LAB — FETAL FIBRONECTIN: Fetal Fibronectin: NEGATIVE

## 2014-06-10 MED ORDER — NIFEDIPINE 10 MG PO CAPS
20.0000 mg | ORAL_CAPSULE | Freq: Once | ORAL | Status: AC
  Administered 2014-06-10: 20 mg via ORAL
  Filled 2014-06-10: qty 2

## 2014-06-10 MED ORDER — TERBUTALINE SULFATE 1 MG/ML IJ SOLN
0.2500 mg | Freq: Once | INTRAMUSCULAR | Status: AC
  Administered 2014-06-10: 0.25 mg via SUBCUTANEOUS
  Filled 2014-06-10: qty 1

## 2014-06-10 MED ORDER — NIFEDIPINE 20 MG PO CAPS: 20.0000 mg | ORAL_CAPSULE | Freq: Four times a day (QID) | ORAL | Status: DC

## 2014-06-10 MED ORDER — LACTATED RINGERS IV BOLUS (SEPSIS)
1000.0000 mL | Freq: Once | INTRAVENOUS | Status: AC
  Administered 2014-06-10 (×2): 1000 mL via INTRAVENOUS

## 2014-06-10 MED ORDER — NIFEDIPINE 10 MG PO CAPS
10.0000 mg | ORAL_CAPSULE | Freq: Once | ORAL | Status: AC
  Administered 2014-06-10: 10 mg via ORAL
  Filled 2014-06-10: qty 1

## 2014-06-10 MED ORDER — NIFEDIPINE 10 MG PO CAPS: 10.0000 mg | ORAL_CAPSULE | Freq: Three times a day (TID) | ORAL | Status: DC

## 2014-06-10 MED ORDER — LACTATED RINGERS IV SOLN
INTRAVENOUS | Status: DC
  Administered 2014-06-10: 20:00:00 via INTRAVENOUS

## 2014-06-10 NOTE — MAU Note (Signed)
Pt presents to MAU with complaints of contractions that started early this morning, states the contractions spaced out but have not gotten stronger and closer. Denies any vaginal bleeding or LOF

## 2014-06-10 NOTE — MAU Provider Note (Signed)
None     Chief Complaint:  Contractions   Amy Rios is  29 y.o. G3P2002 at 9171w2d presents complaining of Contractions She states that they started this am, are now regular q 2minutes and are "pretty strong."  She has had 2 babies without epidurals, and feels as if she is in early labor.  Denies bleeding, ROM.  She was dx with short (2.0) cm cervix 2 days ago, and started on vaginal progesterone.  No recent intercourse or pelvic exam.    Obstetrical/Gynecological History: OB History    Gravida Para Term Preterm AB TAB SAB Ectopic Multiple Living   3 2 2       2      Past Medical History: Past Medical History  Diagnosis Date  . No pertinent past medical history   . Medical history non-contributory     Past Surgical History: Past Surgical History  Procedure Laterality Date  . Wisdom tooth extraction      Family History: Family History  Problem Relation Age of Onset  . Cancer Mother   . Hypertension Maternal Grandmother     Social History: History  Substance Use Topics  . Smoking status: Never Smoker   . Smokeless tobacco: Never Used  . Alcohol Use: No    Allergies: No Known Allergies  Meds:  Prescriptions prior to admission  Medication Sig Dispense Refill Last Dose  . calcium carbonate (TUMS - DOSED IN MG ELEMENTAL CALCIUM) 500 MG chewable tablet Chew 1 tablet by mouth 2 (two) times daily as needed for indigestion or heartburn.    Past Week at Unknown time  . Prenatal Vit-Fe Fumarate-FA (PRENATAL MULTIVITAMIN) TABS tablet Take 1 tablet by mouth daily.    Past Week at Unknown time  . progesterone 200 MG SUPP Place 200 mg vaginally at bedtime.   Past Week at Unknown time  . ibuprofen (ADVIL,MOTRIN) 600 MG tablet Take 1 tablet (600 mg total) by mouth every 6 (six) hours. (Patient not taking: Reported on 06/10/2014) 30 tablet 0     Review of Systems   Constitutional: Negative for fever and chills Eyes: Negative for visual disturbances Respiratory: Negative for  shortness of breath, dyspnea Cardiovascular: Negative for chest pain or palpitations  Gastrointestinal: Negative for vomiting, diarrhea and constipation Genitourinary: Negative for dysuria and urgency Musculoskeletal: Negative for joint pain, myalgias.  Normal ROM  Neurological: Negative for dizziness and headaches    Physical Exam  Blood pressure 112/66, pulse 97, temperature 97.9 F (36.6 C), resp. rate 18, height 5\' 1"  (1.549 m), weight 53.978 kg (119 lb), last menstrual period 12/22/2013, unknown if currently breastfeeding. GENERAL: Well-developed, well-nourished female in no acute distress.  LUNGS: Clear to auscultation bilaterally.  HEART: Regular rate and rhythm. ABDOMEN: Soft, nontender, nondistended, gravid.  EXTREMITIES: Nontender, no edema, 2+ distal pulses. DTR's 2+ CERVICAL EXAM: Cx outer os 1cm, inner os closed. 50% effaced, ? PP    Presentation: undeterminable  FHT:  Baseline rate 150 bpm   Variability moderate  Accelerations absent   Decelerations none.  Reassuring tracing for 24 weeks Contractions: Every 2 mins   Labs: Results for orders placed or performed during the hospital encounter of 06/10/14 (from the past 24 hour(s))  Urinalysis, Routine w reflex microscopic (not at [facility])   Collection Time: 06/10/14  4:15 PM  Result Value Ref Range   Color, Urine YELLOW YELLOW   APPearance CLEAR CLEAR   Specific Gravity, Urine 1.020 1.005 - 1.030   pH 6.0 5.0 - 8.0   Glucose,  UA NEGATIVE NEGATIVE mg/dL   Hgb urine dipstick NEGATIVE NEGATIVE   Bilirubin Urine NEGATIVE NEGATIVE   Ketones, ur 40 (A) NEGATIVE mg/dL   Protein, ur NEGATIVE NEGATIVE mg/dL   Urobilinogen, UA 0.2 0.0 - 1.0 mg/dL   Nitrite NEGATIVE NEGATIVE   Leukocytes, UA MODERATE (A) NEGATIVE  Urine microscopic-add on   Collection Time: 06/10/14  4:15 PM  Result Value Ref Range   Squamous Epithelial / LPF MANY (A) RARE   WBC, UA 7-10 <3 WBC/hpf   Bacteria, UA RARE RARE   Urine-Other MUCOUS PRESENT    Fetal fibronectin   Collection Time: 06/10/14  5:00 PM  Result Value Ref Range   Fetal Fibronectin NEGATIVE NEGATIVE   Imaging Studies:  No results found.   Dr. Jackelyn Knife notified.  Pt received 1L LR and  Procardia without any results.  Terbutaline 0.25mg  SQ @ 1900. Recheck of cervix (2 hours from first exam) is unchanged.    8:44 PM Ctx slowed down for about 30 minutes, then resumed.  Pt states they "feel about the same".  Dr Jackelyn Knife notified.  Pt given  Procardia here and a rx for  q 6 hours sent to pharmacy. Assessment: Amy Rios is  29 y.o. G3P2002 at [redacted]w[redacted]d presents with preterm contractions without cervical change.  Plan: DC home with RX. Come back if ctx get stronger.  F/U in am with office  CRESENZO-DISHMAN,Sacora Hawbaker 6/5/20168:44 PM

## 2014-06-10 NOTE — Discharge Instructions (Signed)
Braxton Hicks Contractions °Contractions of the uterus can occur throughout pregnancy. Contractions are not always a sign that you are in labor.  °WHAT ARE BRAXTON HICKS CONTRACTIONS?  °Contractions that occur before labor are called Braxton Hicks contractions, or false labor. Toward the end of pregnancy (32-34 weeks), these contractions can develop more often and may become more forceful. This is not true labor because these contractions do not result in opening (dilatation) and thinning of the cervix. They are sometimes difficult to tell apart from true labor because these contractions can be forceful and people have different pain tolerances. You should not feel embarrassed if you go to the hospital with false labor. Sometimes, the only way to tell if you are in true labor is for your health care provider to look for changes in the cervix. °If there are no prenatal problems or other health problems associated with the pregnancy, it is completely safe to be sent home with false labor and await the onset of true labor. °HOW CAN YOU TELL THE DIFFERENCE BETWEEN TRUE AND FALSE LABOR? °False Labor °· The contractions of false labor are usually shorter and not as hard as those of true labor.   °· The contractions are usually irregular.   °· The contractions are often felt in the front of the lower abdomen and in the groin.   °· The contractions may go away when you walk around or change positions while lying down.   °· The contractions get weaker and are shorter lasting as time goes on.   °· The contractions do not usually become progressively stronger, regular, and closer together as with true labor.   °True Labor °· Contractions in true labor last 30-70 seconds, become very regular, usually become more intense, and increase in frequency.   °· The contractions do not go away with walking.   °· The discomfort is usually felt in the top of the uterus and spreads to the lower abdomen and low back.   °· True labor can be  determined by your health care provider with an exam. This will show that the cervix is dilating and getting thinner.   °WHAT TO REMEMBER °· Keep up with your usual exercises and follow other instructions given by your health care provider.   °· Take medicines as directed by your health care provider.   °· Keep your regular prenatal appointments.   °· Eat and drink lightly if you think you are going into labor.   °· If Braxton Hicks contractions are making you uncomfortable:   °· Change your position from lying down or resting to walking, or from walking to resting.   °· Sit and rest in a tub of warm water.   °· Drink 2-3 glasses of water. Dehydration may cause these contractions.   °· Do slow and deep breathing several times an hour.   °WHEN SHOULD I SEEK IMMEDIATE MEDICAL CARE? °Seek immediate medical care if: °· Your contractions become stronger, more regular, and closer together.   °· You have fluid leaking or gushing from your vagina.   °· You have a fever.   °· You pass blood-tinged mucus.   °· You have vaginal bleeding.   °· You have continuous abdominal pain.   °· You have low back pain that you never had before.   °· You feel your baby's head pushing down and causing pelvic pressure.   °· Your baby is not moving as much as it used to.   °Document Released: 12/22/2004 Document Revised: 12/27/2012 Document Reviewed: 10/03/2012 °ExitCare® Patient Information ©2015 [employer]. This information is not intended to replace advice given to you by your health care   provider. Make sure you discuss any questions you have with your health care provider. °Abdominal Pain During Pregnancy °Abdominal pain is common in pregnancy. Most of the time, it does not cause harm. There are many causes of abdominal pain. Some causes are more serious than others. Some of the causes of abdominal pain in pregnancy are easily diagnosed. Occasionally, the diagnosis takes time to understand. Other times, the cause is not determined.  Abdominal pain can be a sign that something is very wrong with the pregnancy, or the pain may have nothing to do with the pregnancy at all. For this reason, always tell your health care provider if you have any abdominal discomfort. °HOME CARE INSTRUCTIONS  °Monitor your abdominal pain for any changes. The following actions may help to alleviate any discomfort you are experiencing: °· Do not have sexual intercourse or put anything in your vagina until your symptoms go away completely. °· Get plenty of rest until your pain improves. °· Drink clear fluids if you feel nauseous. Avoid solid food as long as you are uncomfortable or nauseous. °· Only take over-the-counter or prescription medicine as directed by your health care provider. °· Keep all follow-up appointments with your health care provider. °SEEK IMMEDIATE MEDICAL CARE IF: °· You are bleeding, leaking fluid, or passing tissue from the vagina. °· You have increasing pain or cramping. °· You have persistent vomiting. °· You have painful or bloody urination. °· You have a fever. °· You notice a decrease in your baby's movements. °· You have extreme weakness or feel faint. °· You have shortness of breath, with or without abdominal pain. °· You develop a severe headache with abdominal pain. °· You have abnormal vaginal discharge with abdominal pain. °· You have persistent diarrhea. °· You have abdominal pain that continues even after rest, or gets worse. °MAKE SURE YOU:  °· Understand these instructions. °· Will watch your condition. °· Will get help right away if you are not doing well or get worse. °Document Released: 12/22/2004 Document Revised: 10/12/2012 Document Reviewed: 07/21/2012 °ExitCare® Patient Information ©2015 [employer]. This information is not intended to replace advice given to you by your health care provider. Make sure you discuss any questions you have with your health care provider. ° °

## 2014-07-17 ENCOUNTER — Inpatient Hospital Stay (HOSPITAL_COMMUNITY)
Admission: AD | Admit: 2014-07-17 | Discharge: 2014-07-17 | Disposition: A | Discharge status: Home / Self Care | Payer: BLUE CROSS/BLUE SHIELD | Source: Ambulatory Visit | Attending: Obstetrics and Gynecology | Admitting: Obstetrics and Gynecology

## 2014-07-17 ENCOUNTER — Encounter (HOSPITAL_COMMUNITY): Payer: Self-pay | Admitting: *Deleted

## 2014-07-17 DIAGNOSIS — O9989 Other specified diseases and conditions complicating pregnancy, childbirth and the puerperium: Secondary | ICD-10-CM | POA: Insufficient documentation

## 2014-07-17 DIAGNOSIS — B085 Enteroviral vesicular pharyngitis: Secondary | ICD-10-CM | POA: Insufficient documentation

## 2014-07-17 DIAGNOSIS — Z3A29 29 weeks gestation of pregnancy: Secondary | ICD-10-CM | POA: Insufficient documentation

## 2014-07-17 DIAGNOSIS — M2550 Pain in unspecified joint: Secondary | ICD-10-CM | POA: Diagnosis not present

## 2014-07-17 DIAGNOSIS — E860 Dehydration: Secondary | ICD-10-CM | POA: Insufficient documentation

## 2014-07-17 DIAGNOSIS — R509 Fever, unspecified: Secondary | ICD-10-CM | POA: Diagnosis present

## 2014-07-17 LAB — CBC
HEMATOCRIT: 34 % — AB (ref 36.0–46.0)
Hemoglobin: 11.4 g/dL — ABNORMAL LOW (ref 12.0–15.0)
MCH: 30.6 pg (ref 26.0–34.0)
MCHC: 33.5 g/dL (ref 30.0–36.0)
MCV: 91.4 fL (ref 78.0–100.0)
Platelets: 129 10*3/uL — ABNORMAL LOW (ref 150–400)
RBC: 3.72 MIL/uL — AB (ref 3.87–5.11)
RDW: 14.1 % (ref 11.5–15.5)
WBC: 7 10*3/uL (ref 4.0–10.5)

## 2014-07-17 LAB — URINALYSIS, ROUTINE W REFLEX MICROSCOPIC
Bilirubin Urine: NEGATIVE
GLUCOSE, UA: NEGATIVE mg/dL
Hgb urine dipstick: NEGATIVE
Ketones, ur: 40 mg/dL — AB
NITRITE: NEGATIVE
PH: 7.5 (ref 5.0–8.0)
PROTEIN: 30 mg/dL — AB
Specific Gravity, Urine: 1.015 (ref 1.005–1.030)
Urobilinogen, UA: 1 mg/dL (ref 0.0–1.0)

## 2014-07-17 LAB — URINE MICROSCOPIC-ADD ON

## 2014-07-17 MED ORDER — PROMETHAZINE HCL 12.5 MG PO TABS: 12.5000 mg | ORAL_TABLET | Freq: Four times a day (QID) | ORAL | Status: DC | PRN

## 2014-07-17 MED ORDER — ACETAMINOPHEN 500 MG PO TABS
1000.0000 mg | ORAL_TABLET | Freq: Four times a day (QID) | ORAL | Status: DC | PRN
  Administered 2014-07-17: 1000 mg via ORAL
  Filled 2014-07-17: qty 2

## 2014-07-17 MED ORDER — MAGIC MOUTHWASH W/LIDOCAINE
5.0000 mL | Freq: Four times a day (QID) | ORAL | Status: DC | PRN
Start: 2014-07-17 — End: 2014-09-19

## 2014-07-17 NOTE — MAU Note (Addendum)
Fever, chills and joint pain.  Started this morning.  Kids have been sick.  Fever this morning was 102.0.  No GI symptoms. No resp symptoms

## 2014-07-17 NOTE — Discharge Instructions (Signed)
Herpangina  °Herpangina is a viral illness that causes sores inside the mouth and throat. It can be passed from person to person (contagious). Most cases of herpangina occur in the summer. °CAUSES  °Herpangina is caused by a virus. This virus can be spread by saliva and mouth-to-mouth contact. It can also be spread through contact with an infected person's stools. It usually takes 3 to 6 days after exposure to show signs of infection. °SYMPTOMS  °· Fever. °· Very sore, red throat. °· Small blisters in the back of the throat. °· Sores inside the mouth, lips, cheeks, and in the throat. °· Blisters around the outside of the mouth. °· Painful blisters on the palms of the hands and soles of the feet. °· Irritability. °· Poor appetite. °· Dehydration. °DIAGNOSIS  °This diagnosis is made by a physical exam. Lab tests are usually not required. °TREATMENT  °This illness normally goes away on its own within 1 week. Medicines may be given to ease your symptoms. °HOME CARE INSTRUCTIONS  °· Avoid salty, spicy, or acidic food and drinks. These foods may make your sores more painful. °· If the patient is a baby or young child, weigh your child daily to check for dehydration. Rapid weight loss indicates there is not enough fluid intake. Consult your caregiver immediately. °· Ask your caregiver for specific rehydration instructions. °· Only take over-the-counter or prescription medicines for pain, discomfort, or fever as directed by your caregiver. °SEEK IMMEDIATE MEDICAL CARE IF:  °· Your pain is not relieved with medicine. °· You have signs of dehydration, such as dry lips and mouth, dizziness, dark urine, confusion, or a rapid pulse. °MAKE SURE YOU: °· Understand these instructions. °· Will watch your condition. °· Will get help right away if you are not doing well or get worse. °Document Released: 09/20/2002 Document Revised: 03/16/2011 Document Reviewed: 07/14/2010 °ExitCare® Patient Information ©2015 [employer]. This  information is not intended to replace advice given to you by your health care provider. Make sure you discuss any questions you have with your health care provider. ° °

## 2014-07-17 NOTE — MAU Provider Note (Signed)
History     CSN: [id number] date and time: 07/17/14 1726   None     Chief Complaint  Patient presents with  . Fever  . Joint Pain   HPI  Patient is 29 y.o. [id number] [redacted]w[redacted]d.  Has 29yo, 29yo with blisters/sores on tongues and on diaper area, fever recently.  Today patient started having fever up to 102.  Sore in all joints, has not taken anything today. + fevers, sweats, chills, malaise  +FM, denies LOF, VB, + baseline vaginal discharge.      Past Medical History  Diagnosis Date  . No pertinent past medical history   . Medical history non-contributory     Past Surgical History  Procedure Laterality Date  . Wisdom tooth extraction      Family History  Problem Relation Age of Onset  . Cancer Mother   . Hypertension Maternal Grandmother     History  Substance Use Topics  . Smoking status: Never Smoker   . Smokeless tobacco: Never Used  . Alcohol Use: No    Allergies: No Known Allergies  Prescriptions prior to admission  Medication Sig Dispense Refill Last Dose  . calcium carbonate (TUMS - DOSED IN MG ELEMENTAL CALCIUM) 500 MG chewable tablet Chew 1 tablet by mouth 2 (two) times daily as needed for indigestion or heartburn.    Past Week at Unknown time  . NIFEdipine (PROCARDIA) 20 MG capsule Take 1 capsule (20 mg total) by mouth 4 (four) times daily. (Patient taking differently: Take 20 mg by mouth 4 (four) times daily as needed (For contractions.). ) 80 capsule 1 07/16/2014 at Unknown time  . Prenatal Vit-Fe Fumarate-FA (PRENATAL MULTIVITAMIN) TABS tablet Take 1 tablet by mouth daily.    Past Week at Unknown time  . progesterone 200 MG SUPP Place 200 mg vaginally at bedtime.   07/16/2014 at Unknown time    Review of Systems  Constitutional: Positive for fever, chills and malaise/fatigue. Negative for diaphoresis.  HENT: Negative for congestion and sore throat.   Respiratory: Negative for cough and shortness of breath.   Cardiovascular: Negative for chest  pain and leg swelling.  Gastrointestinal: Positive for heartburn and nausea. Negative for vomiting, abdominal pain and diarrhea (loose stools).  Genitourinary: Negative for dysuria, urgency, frequency and hematuria.  Musculoskeletal: Positive for myalgias and neck pain (initially reported some but none currently).  Skin: Negative for itching and rash.  Neurological: Positive for headaches. Negative for dizziness and loss of consciousness.   Physical Exam   Blood pressure 112/58, pulse 116, temperature 99.8 F (37.7 C), temperature source Oral, resp. rate 20, weight 122 lb (55.339 kg), last menstrual period 12/22/2013, SpO2 98 %, unknown if currently breastfeeding.  Physical Exam  Constitutional: She is oriented to person, place, and time. She appears well-developed and well-nourished.  Nontoxic appearing, but ill-appearing  HENT:  Head: Normocephalic and atraumatic.  Eyes: Conjunctivae and EOM are normal.  Neck: Normal range of motion.  Cardiovascular: Normal rate.   Respiratory: Effort normal. No respiratory distress.  GI: Soft. She exhibits no distension. There is no tenderness.  Musculoskeletal: Normal range of motion. She exhibits no edema.  Neurological: She is alert and oriented to person, place, and time.  No photophobia, no stiff neck, no neck pain  Skin: Skin is warm. She is diaphoretic. No erythema.    Dilation: 1 Effacement (%): 30 Cervical Position: Posterior Exam by:: Dr. Loreta Ave  MAU Course  Procedures  MDM NST reactive, baseline 160  Assessment and  Plan  Patient is 29 y.o. [id number] 8948w4d reporting fevers/arthralgias likely secondary to early herpangina and braxton hicks contractions, dehydration - fetal kick counts reinforced - preterm labor precautions: cervical exam same as previously in clinic several days ago - dehydration: declined IV fluids, encouraged PO hydration - herpangina: advised of likely oral lesions soon to appear, rx magic mouthwash, phenergan  12.5mg  prn nausea.  1g tylenol prn fever advised.  - meningitis precautions discussed given resolved neck pain, however no signs of meningitis on exam and normal WBC - UA=> urine culture  Care discussed with Dr. Kem BoroughsHenley   Dossie Swor ROCIO 07/17/2014, 6:18 PM

## 2014-07-19 LAB — CULTURE, OB URINE

## 2014-09-18 ENCOUNTER — Inpatient Hospital Stay (HOSPITAL_COMMUNITY)
Admission: AD | Admit: 2014-09-18 | Discharge: 2014-09-18 | Disposition: A | Discharge status: Home / Self Care | Payer: BLUE CROSS/BLUE SHIELD | Source: Ambulatory Visit | Attending: Obstetrics and Gynecology | Admitting: Obstetrics and Gynecology

## 2014-09-18 ENCOUNTER — Encounter (HOSPITAL_COMMUNITY): Payer: Self-pay | Admitting: *Deleted

## 2014-09-18 NOTE — Progress Notes (Signed)
Report called to Dr Ellyn Hack regarding pt's admission and status. Aware of ctx pattern, sve, reactive FHR with variables.Pt may walk and will reck cervix

## 2014-09-18 NOTE — Discharge Instructions (Signed)
Braxton Hicks Contractions °Contractions of the uterus can occur throughout pregnancy. Contractions are not always a sign that you are in labor.  °WHAT ARE BRAXTON HICKS CONTRACTIONS?  °Contractions that occur before labor are called Braxton Hicks contractions, or false labor. Toward the end of pregnancy (32-34 weeks), these contractions can develop more often and may become more forceful. This is not true labor because these contractions do not result in opening (dilatation) and thinning of the cervix. They are sometimes difficult to tell apart from true labor because these contractions can be forceful and people have different pain tolerances. You should not feel embarrassed if you go to the hospital with false labor. Sometimes, the only way to tell if you are in true labor is for your health care provider to look for changes in the cervix. °If there are no prenatal problems or other health problems associated with the pregnancy, it is completely safe to be sent home with false labor and await the onset of true labor. °HOW CAN YOU TELL THE DIFFERENCE BETWEEN TRUE AND FALSE LABOR? °False Labor °· The contractions of false labor are usually shorter and not as hard as those of true labor.   °· The contractions are usually irregular.   °· The contractions are often felt in the front of the lower abdomen and in the groin.   °· The contractions may go away when you walk around or change positions while lying down.   °· The contractions get weaker and are shorter lasting as time goes on.   °· The contractions do not usually become progressively stronger, regular, and closer together as with true labor.   °True Labor °· Contractions in true labor last 30-70 seconds, become very regular, usually become more intense, and increase in frequency.   °· The contractions do not go away with walking.   °· The discomfort is usually felt in the top of the uterus and spreads to the lower abdomen and low back.   °· True labor can be  determined by your health care provider with an exam. This will show that the cervix is dilating and getting thinner.   °WHAT TO REMEMBER °· Keep up with your usual exercises and follow other instructions given by your health care provider.   °· Take medicines as directed by your health care provider.   °· Keep your regular prenatal appointments.   °· Eat and drink lightly if you think you are going into labor.   °· If Braxton Hicks contractions are making you uncomfortable:   °¨ Change your position from lying down or resting to walking, or from walking to resting.   °¨ Sit and rest in a tub of warm water.   °¨ Drink 2-3 glasses of water. Dehydration may cause these contractions.   °¨ Do slow and deep breathing several times an hour.   °WHEN SHOULD I SEEK IMMEDIATE MEDICAL CARE? °Seek immediate medical care if: °· Your contractions become stronger, more regular, and closer together.   °· You have fluid leaking or gushing from your vagina.   °· You have a fever.   °· You pass blood-tinged mucus.   °· You have vaginal bleeding.   °· You have continuous abdominal pain.   °· You have low back pain that you never had before.   °· You feel your baby's head pushing down and causing pelvic pressure.   °· Your baby is not moving as much as it used to.   °Document Released: 12/22/2004 Document Revised: 12/27/2012 Document Reviewed: 10/03/2012 °Exit[employer]® Patient Information ©2015 [employer]. This information is not intended to replace advice given to you by your health care   provider. Make sure you discuss any questions you have with your health care provider. ° °

## 2014-09-18 NOTE — MAU Note (Signed)
uc's since 0300, denies bleeding or LOF.  SVE last week 4 cm's.

## 2014-09-18 NOTE — Progress Notes (Signed)
Dr Ellyn Hack notified of pt's cervical reck. Pt stable for d/c home. Pt lives about  from hosp. Knows to return ASAP for SROM or increased pain with contractions. Other labor precautions reviewed.

## 2014-09-19 ENCOUNTER — Inpatient Hospital Stay (HOSPITAL_COMMUNITY)
Admission: AD | Admit: 2014-09-19 | Discharge: 2014-09-20 | DRG: 775 | Disposition: A | Discharge status: Home / Self Care | Payer: BLUE CROSS/BLUE SHIELD | Source: Ambulatory Visit | Attending: Obstetrics and Gynecology | Admitting: Obstetrics and Gynecology

## 2014-09-19 ENCOUNTER — Encounter (HOSPITAL_COMMUNITY): Payer: Self-pay

## 2014-09-19 DIAGNOSIS — Z3A38 38 weeks gestation of pregnancy: Secondary | ICD-10-CM | POA: Diagnosis present

## 2014-09-19 DIAGNOSIS — Z3490 Encounter for supervision of normal pregnancy, unspecified, unspecified trimester: Secondary | ICD-10-CM

## 2014-09-19 DIAGNOSIS — Z8249 Family history of ischemic heart disease and other diseases of the circulatory system: Secondary | ICD-10-CM

## 2014-09-19 LAB — CBC
HCT: 36.7 % (ref 36.0–46.0)
Hemoglobin: 12.3 g/dL (ref 12.0–15.0)
MCH: 30.2 pg (ref 26.0–34.0)
MCHC: 33.5 g/dL (ref 30.0–36.0)
MCV: 90.2 fL (ref 78.0–100.0)
PLATELETS: 207 10*3/uL (ref 150–400)
RBC: 4.07 MIL/uL (ref 3.87–5.11)
RDW: 13.8 % (ref 11.5–15.5)
WBC: 11.1 10*3/uL — ABNORMAL HIGH (ref 4.0–10.5)

## 2014-09-19 MED ORDER — OXYCODONE-ACETAMINOPHEN 5-325 MG PO TABS: 2.0000 | ORAL_TABLET | ORAL | Status: DC | PRN

## 2014-09-19 MED ORDER — CITRIC ACID-SODIUM CITRATE 334-500 MG/5ML PO SOLN: 30.0000 mL | ORAL | Status: DC | PRN

## 2014-09-19 MED ORDER — ONDANSETRON HCL 4 MG PO TABS: 4.0000 mg | ORAL_TABLET | ORAL | Status: DC | PRN

## 2014-09-19 MED ORDER — LANOLIN HYDROUS EX OINT: TOPICAL_OINTMENT | CUTANEOUS | Status: DC | PRN

## 2014-09-19 MED ORDER — LACTATED RINGERS IV SOLN
INTRAVENOUS | Status: DC
  Administered 2014-09-19: 16:00:00 via INTRAVENOUS

## 2014-09-19 MED ORDER — ONDANSETRON HCL 4 MG/2ML IJ SOLN: 4.0000 mg | Freq: Four times a day (QID) | INTRAMUSCULAR | Status: DC | PRN

## 2014-09-19 MED ORDER — DIPHENHYDRAMINE HCL 25 MG PO CAPS: 25.0000 mg | ORAL_CAPSULE | Freq: Four times a day (QID) | ORAL | Status: DC | PRN

## 2014-09-19 MED ORDER — LIDOCAINE HCL (PF) 1 % IJ SOLN
30.0000 mL | INTRAMUSCULAR | Status: DC | PRN
  Filled 2014-09-19: qty 30

## 2014-09-19 MED ORDER — SODIUM CHLORIDE 0.9 % IJ SOLN: 3.0000 mL | Freq: Two times a day (BID) | INTRAMUSCULAR | Status: DC

## 2014-09-19 MED ORDER — SODIUM CHLORIDE 0.9 % IV SOLN: 250.0000 mL | INTRAVENOUS | Status: DC | PRN

## 2014-09-19 MED ORDER — ACETAMINOPHEN 325 MG PO TABS: 650.0000 mg | ORAL_TABLET | ORAL | Status: DC | PRN

## 2014-09-19 MED ORDER — OXYTOCIN 40 UNITS IN LACTATED RINGERS INFUSION - SIMPLE MED
62.5000 mL/h | INTRAVENOUS | Status: DC
  Administered 2014-09-19: 62.5 mL/h via INTRAVENOUS
  Filled 2014-09-19: qty 1000

## 2014-09-19 MED ORDER — ONDANSETRON HCL 4 MG/2ML IJ SOLN: 4.0000 mg | INTRAMUSCULAR | Status: DC | PRN

## 2014-09-19 MED ORDER — SIMETHICONE 80 MG PO CHEW: 80.0000 mg | CHEWABLE_TABLET | ORAL | Status: DC | PRN

## 2014-09-19 MED ORDER — PRENATAL MULTIVITAMIN CH
1.0000 | ORAL_TABLET | Freq: Every day | ORAL | Status: DC
  Administered 2014-09-20: 1 via ORAL
  Filled 2014-09-19: qty 1

## 2014-09-19 MED ORDER — OXYTOCIN BOLUS FROM INFUSION
500.0000 mL | INTRAVENOUS | Status: DC
  Administered 2014-09-19: 500 mL via INTRAVENOUS

## 2014-09-19 MED ORDER — OXYCODONE-ACETAMINOPHEN 5-325 MG PO TABS: 1.0000 | ORAL_TABLET | ORAL | Status: DC | PRN

## 2014-09-19 MED ORDER — SENNOSIDES-DOCUSATE SODIUM 8.6-50 MG PO TABS
2.0000 | ORAL_TABLET | ORAL | Status: DC
  Administered 2014-09-19: 2 via ORAL
  Filled 2014-09-19: qty 2

## 2014-09-19 MED ORDER — SODIUM CHLORIDE 0.9 % IJ SOLN: 3.0000 mL | INTRAMUSCULAR | Status: DC | PRN

## 2014-09-19 MED ORDER — IBUPROFEN 600 MG PO TABS
600.0000 mg | ORAL_TABLET | Freq: Four times a day (QID) | ORAL | Status: DC
Start: 2014-09-19 — End: 2014-09-20
  Administered 2014-09-19 – 2014-09-20 (×5): 600 mg via ORAL
  Filled 2014-09-19 (×5): qty 1

## 2014-09-19 MED ORDER — FLEET ENEMA 7-19 GM/118ML RE ENEM: 1.0000 | ENEMA | RECTAL | Status: DC | PRN

## 2014-09-19 MED ORDER — WITCH HAZEL-GLYCERIN EX PADS: 1.0000 "application " | MEDICATED_PAD | CUTANEOUS | Status: DC | PRN

## 2014-09-19 MED ORDER — BENZOCAINE-MENTHOL 20-0.5 % EX AERO
1.0000 "application " | INHALATION_SPRAY | CUTANEOUS | Status: DC | PRN
  Filled 2014-09-19: qty 56

## 2014-09-19 MED ORDER — ZOLPIDEM TARTRATE 5 MG PO TABS: 5.0000 mg | ORAL_TABLET | Freq: Every evening | ORAL | Status: DC | PRN

## 2014-09-19 MED ORDER — LACTATED RINGERS IV SOLN: 500.0000 mL | INTRAVENOUS | Status: DC | PRN

## 2014-09-19 MED ORDER — DIBUCAINE 1 % RE OINT: 1.0000 "application " | TOPICAL_OINTMENT | RECTAL | Status: DC | PRN

## 2014-09-19 MED ORDER — BUTORPHANOL TARTRATE 1 MG/ML IJ SOLN
1.0000 mg | INTRAMUSCULAR | Status: DC | PRN
Start: 2014-09-19 — End: 2014-09-19
  Filled 2014-09-19: qty 1

## 2014-09-19 MED ORDER — TETANUS-DIPHTH-ACELL PERTUSSIS 5-2.5-18.5 LF-MCG/0.5 IM SUSP: 0.5000 mL | Freq: Once | INTRAMUSCULAR | Status: DC

## 2014-09-19 NOTE — Progress Notes (Signed)
Partial Delivery Summary  I was called to attend this delivery as the patient's private attending was en route. I arrived to the room with the patient on hands and knees, pushing, with decels to 100s with pushing with good recovery. The fetus crowned shortly thereafter and delivered in one push with no nuchal or shoulder dystocia. The patient's private attending then arrived and assumed care.

## 2014-09-19 NOTE — H&P (Signed)
Amy Rios is a 30 y.o. female G3P2002 at 110 5/7 weeks (EDD 09/28/14 by LMP c/w 6 week [place]) presenting for contractions and cevical change.  Prenatal care complicated by h/o PTL with last pregnancies, but delivered at term.  She was counseled about 17-P but declined.  At 24 weeks cervical shortening to 2cm was noted, and she was agreeable to progesterone suppositories which she began.  She had some preterm contractions this pregnancy treated with procardia.  Cervix yesterday was 4-5 cm and in office today was 6+cm.  She was noted to be size<dates and had an [place] which confirmed normal growth 09/11/14  47%ile and AFI 10.    Maternal Medical History:  Reason for admission: Contractions.   Contractions: Onset was 3-5 hours ago.   Frequency: regular.   Perceived severity is moderate.    Prenatal Complications - Diabetes: none.    OB History    Gravida Para Term Preterm AB TAB SAB Ectopic Multiple Living   2012 NSVD 6#3oz 2014 NSVD 6#6oz SAB x 1 2015  Past Medical History  Diagnosis Date  . No pertinent past medical history   . Medical history non-contributory    Past Surgical History  Procedure Laterality Date  . Wisdom tooth extraction     Family History: family history includes Cancer in her mother; Hypertension in her maternal grandmother. Social History:  reports that she has never smoked. She has never used smokeless tobacco. She reports that she does not drink alcohol or use illicit drugs.   Prenatal Transfer Tool  Maternal Diabetes: No Genetic Screening: Declined Maternal Ultrasounds/Referrals: Normal Fetal Ultrasounds or other Referrals:  None Maternal Substance Abuse:  No Significant Maternal Medications:  None Significant Maternal Lab Results:  None Other Comments:  None  ROS    Blood pressure 122/71, pulse 98, resp. rate 20, last menstrual period 12/22/2013, unknown if currently breastfeeding. Maternal Exam:  Uterine Assessment: Contraction  strength is moderate.  Contraction frequency is regular.   Abdomen: Fetal presentation: vertex  Introitus: Normal vulva. Normal vagina.    Physical Exam  Constitutional: She appears well-developed and well-nourished.  Cardiovascular: Normal rate and regular rhythm.   Respiratory: Effort normal.  GI: Soft.  Neurological: She is alert.  Psychiatric: She has a normal mood and affect.    Prenatal labs: ABO, Rh:  O neg Antibody:  neg Rubella:  Immune RPR:   NR HBsAg:   Neg HIV:   NR GBS:   Neg One hour GCT 135 Three hour GTT normal Declined genetic screenings  Assessment/Plan: Pt admitted in labor will monitor progress.  AROM if needed.  Oliver Pila 09/19/2014, 12:52 PM

## 2014-09-20 ENCOUNTER — Encounter (HOSPITAL_COMMUNITY): Payer: Self-pay | Admitting: *Deleted

## 2014-09-20 LAB — RAPID HIV SCREEN (HIV 1/2 AB+AG)
HIV 1/2 ANTIBODIES: NONREACTIVE
HIV-1 P24 ANTIGEN - HIV24: NONREACTIVE

## 2014-09-20 LAB — CBC
HEMATOCRIT: 31.9 % — AB (ref 36.0–46.0)
Hemoglobin: 10.5 g/dL — ABNORMAL LOW (ref 12.0–15.0)
MCH: 29.8 pg (ref 26.0–34.0)
MCHC: 32.9 g/dL (ref 30.0–36.0)
MCV: 90.6 fL (ref 78.0–100.0)
PLATELETS: 162 10*3/uL (ref 150–400)
RBC: 3.52 MIL/uL — ABNORMAL LOW (ref 3.87–5.11)
RDW: 13.8 % (ref 11.5–15.5)
WBC: 11.9 10*3/uL — AB (ref 4.0–10.5)

## 2014-09-20 LAB — RPR: RPR: NONREACTIVE

## 2014-09-20 MED ORDER — RHO D IMMUNE GLOBULIN 1500 UNIT/2ML IJ SOSY
300.0000 ug | PREFILLED_SYRINGE | Freq: Once | INTRAMUSCULAR | Status: AC
  Administered 2014-09-20: 300 ug via INTRAVENOUS
  Filled 2014-09-20: qty 2

## 2014-09-20 MED ORDER — IBUPROFEN 600 MG PO TABS: 600.0000 mg | ORAL_TABLET | Freq: Four times a day (QID) | ORAL | Status: DC

## 2014-09-20 NOTE — Progress Notes (Addendum)
Post Partum Day 1 Subjective: no complaints and tolerating PO , requests early d/c Objective: Blood pressure 100/56, pulse 69, temperature 98.3 F (36.8 C), temperature source Oral, resp. rate 18, height  (1.575 m), weight 59.421 kg (131 lb), last menstrual period 12/22/2013, unknown if currently breastfeeding.  Physical Exam:  General: alert and cooperative Lochia: appropriate Uterine Fundus: firm     Recent Labs  09/19/14 1315 09/20/14 0523  HGB 12.3 10.5*  HCT 36.7 31.9*    Assessment/Plan: Discharge home   LOS: 1 day   Aloysuis Ribaudo W 09/20/2014, 8:28 AM

## 2014-09-20 NOTE — Discharge Summary (Signed)
Obstetric Discharge Summary Reason for Admission: onset of labor Prenatal Procedures: none Intrapartum Procedures: spontaneous vaginal delivery Postpartum Procedures: none Complications-Operative and Postpartum: none HEMOGLOBIN  Date Value Ref Range Status  09/20/2014 10.5* 12.0 - 15.0 g/dL Final   HCT  Date Value Ref Range Status  09/20/2014 31.9* 36.0 - 46.0 % Final    Physical Exam:  General: alert and cooperative Lochia: appropriate Uterine Fundus: firm   Discharge Diagnoses: Term Pregnancy-delivered  Discharge Information: Date: 09/20/2014 Activity: pelvic rest Diet: routine Medications: Ibuprofen Condition: improved Instructions: refer to practice specific booklet Discharge to: home Follow-up Information    Follow up with Oliver Pila, MD In 6 weeks.   Specialty:  Obstetrics and Gynecology   Why:  postpartum   Contact information:   [address] [place] [address] [phone]       Newborn Data: Live born female  Birth Weight: 7 lb 0.2 oz (3181 g) APGAR: 8, 9  Home with mother.  Oliver Pila 09/20/2014, 8:44 AM

## 2014-09-21 ENCOUNTER — Ambulatory Visit: Payer: Self-pay

## 2014-09-21 LAB — RH IG WORKUP (INCLUDES ABO/RH)
ABO/RH(D): O NEG
FETAL SCREEN: NEGATIVE
Gestational Age(Wks): 38.5
UNIT DIVISION: 0

## 2014-09-21 NOTE — Lactation Note (Signed)
This note was copied from the chart of Amy Suheyla Mortellaro. Lactation Consultation Note  Hand expression taught to Mom.  Baby latched in football hold.  Sucks and swallow observed. Mother's nipples tender.  Has comfort gels.  Reviewed cluster feeding, ebm, engorgement care and monitoring voids/stools. Encouraged her to come to support group and call if she has further questions.  Patient Name: Amy Rios [id number] Date: 09/21/2014 Reason for consult: Follow-up assessment   Maternal Data    Feeding Feeding Type: Breast Fed Length of feed: 30 min  LATCH Score/Interventions Latch: Grasps breast easily, tongue down, lips flanged, rhythmical sucking.  Audible Swallowing: Spontaneous and intermittent  Type of Nipple: Everted at rest and after stimulation  Comfort (Breast/Nipple): Filling, red/small blisters or bruises, mild/mod discomfort  Interventions (Mild/moderate discomfort): Hand expression;Comfort gels  Hold (Positioning): No assistance needed to correctly position infant at breast.  LATCH Score: 9  Lactation Tools Discussed/Used     Consult Status Consult Status: Complete    [name] 09/21/2014, 9:06 AM

## 2014-09-23 LAB — TYPE AND SCREEN
ABO/RH(D): O NEG
Antibody Screen: POSITIVE
DAT, IgG: NEGATIVE
Unit division: 0
Unit division: 0

## 2015-02-25 ENCOUNTER — Ambulatory Visit (HOSPITAL_COMMUNITY)
Admission: AD | Admit: 2015-02-25 | Discharge: 2015-02-26 | Disposition: A | Discharge status: Home / Self Care | Payer: BLUE CROSS/BLUE SHIELD | Source: Ambulatory Visit | Attending: Obstetrics and Gynecology | Admitting: Obstetrics and Gynecology

## 2015-02-25 ENCOUNTER — Encounter (HOSPITAL_COMMUNITY): Payer: Self-pay | Admitting: Medical

## 2015-02-25 ENCOUNTER — Inpatient Hospital Stay (HOSPITAL_COMMUNITY): Payer: BLUE CROSS/BLUE SHIELD

## 2015-02-25 DIAGNOSIS — O001 Tubal pregnancy without intrauterine pregnancy: Secondary | ICD-10-CM | POA: Diagnosis not present

## 2015-02-25 DIAGNOSIS — O209 Hemorrhage in early pregnancy, unspecified: Secondary | ICD-10-CM

## 2015-02-25 DIAGNOSIS — O00109 Unspecified tubal pregnancy without intrauterine pregnancy: Secondary | ICD-10-CM | POA: Diagnosis present

## 2015-02-25 HISTORY — DX: Unspecified tubal pregnancy without intrauterine pregnancy: O00.109

## 2015-02-25 LAB — URINALYSIS, ROUTINE W REFLEX MICROSCOPIC
Bilirubin Urine: NEGATIVE
GLUCOSE, UA: NEGATIVE mg/dL
Ketones, ur: 40 mg/dL — AB
LEUKOCYTES UA: NEGATIVE
NITRITE: NEGATIVE
PH: 6 (ref 5.0–8.0)
Protein, ur: NEGATIVE mg/dL
SPECIFIC GRAVITY, URINE: 1.015 (ref 1.005–1.030)

## 2015-02-25 LAB — CBC WITH DIFFERENTIAL/PLATELET
Basophils Absolute: 0 10*3/uL (ref 0.0–0.1)
Basophils Relative: 0 %
EOS ABS: 0.1 10*3/uL (ref 0.0–0.7)
EOS PCT: 1 %
HCT: 37.3 % (ref 36.0–46.0)
HEMOGLOBIN: 12.5 g/dL (ref 12.0–15.0)
LYMPHS ABS: 1.5 10*3/uL (ref 0.7–4.0)
Lymphocytes Relative: 15 %
MCH: 28.9 pg (ref 26.0–34.0)
MCHC: 33.5 g/dL (ref 30.0–36.0)
MCV: 86.3 fL (ref 78.0–100.0)
MONO ABS: 0.4 10*3/uL (ref 0.1–1.0)
MONOS PCT: 4 %
Neutro Abs: 8 10*3/uL — ABNORMAL HIGH (ref 1.7–7.7)
Neutrophils Relative %: 80 %
PLATELETS: 192 10*3/uL (ref 150–400)
RBC: 4.32 MIL/uL (ref 3.87–5.11)
RDW: 13 % (ref 11.5–15.5)
WBC: 10.1 10*3/uL (ref 4.0–10.5)

## 2015-02-25 LAB — URINE MICROSCOPIC-ADD ON

## 2015-02-25 LAB — POCT PREGNANCY, URINE: Preg Test, Ur: POSITIVE — AB

## 2015-02-25 NOTE — MAU Provider Note (Signed)
History     CSN: [id number]  Arrival date and time: 02/25/15 2259   First Provider Initiated Contact with Patient 02/25/15 2331      Chief Complaint  Patient presents with  . Abdominal Pain  . Vaginal Bleeding   HPI [name] is a 30 y.o. [id number] at [redacted]w[redacted]d who presents to MAU today with complaint of vaginal bleeding and LLQ abdominal pain. The patient is currently breastfeeding, but has had 2 regular periods since the birth of her last child. She states LMP was normal 02/06/15. She is not on birth control, but practices natural family planning. She states that the pain started on Saturday night and became worse today. She rates pain at 5/10 now, but recently took both 1000 mg Tylenol and 600 mg Ibuprofen. She has had some nausea without vomiting and also a few episodes of loose stools today. She denies constipation or fever. She also states vaginal bleeding today. This also started on Saturday and progressed to dark brown spotting yesterday and returned to red today. The bleeding is not heavy, but more than spotting, like a normal mid-cycle flow.   OB History    Gravida Para Term Preterm AB TAB SAB Ectopic Multiple Living   5 3 3  1  1   0 3      Past Medical History  Diagnosis Date  . No pertinent past medical history   . Medical history non-contributory     Past Surgical History  Procedure Laterality Date  . Wisdom tooth extraction      Family History  Problem Relation Age of Onset  . Cancer Mother   . Hypertension Maternal Grandmother     Social History  Substance Use Topics  . Smoking status: Never Smoker   . Smokeless tobacco: Never Used  . Alcohol Use: No    Allergies: No Known Allergies  Prescriptions prior to admission  Medication Sig Dispense Refill Last Dose  . acetaminophen (TYLENOL) 500 MG tablet Take 500 mg by mouth every 6 (six) hours as needed for headache.   02/25/2015 at 1830  . ibuprofen (ADVIL,MOTRIN) 600 MG tablet Take 1 tablet (600 mg total)  by mouth every 6 (six) hours. 30 tablet 0 02/25/2015 at 2200  . Prenatal Vit-Fe Fumarate-FA (PRENATAL MULTIVITAMIN) TABS tablet Take 1 tablet by mouth daily.    02/25/2015 at Unknown time    Review of Systems  Constitutional: Negative for fever and malaise/fatigue.  Gastrointestinal: Positive for nausea and abdominal pain. Negative for vomiting, diarrhea and constipation.  Genitourinary: Negative for dysuria, urgency and frequency.       + vaginal bleeding   Physical Exam   Blood pressure 115/77, pulse 92, temperature 98.3 F (36.8 C), temperature source Oral, resp. rate 20, height 5\' 2"  (1.575 m), weight 113 lb (51.256 kg), last menstrual period 02/06/2015, SpO2 100 %, unknown if currently breastfeeding.  Physical Exam  Nursing note and vitals reviewed. Constitutional: She is oriented to person, place, and time. She appears well-developed and well-nourished. No distress.  HENT:  Head: Normocephalic and atraumatic.  Cardiovascular: Normal rate.   Respiratory: Effort normal.  GI: Soft. She exhibits no distension and no mass. There is tenderness (moderate tenderness to palpation of the LLQ). There is no rebound and no guarding.  Genitourinary: Uterus is not enlarged and not tender. Cervix exhibits no motion tenderness, no discharge and no friability. Right adnexum displays no mass and no tenderness. Left adnexum displays tenderness (mild). Left adnexum displays no mass. There is bleeding (  small) in the vagina. No vaginal discharge found.  Neurological: She is alert and oriented to person, place, and time.  Skin: Skin is warm and dry. No erythema.  Psychiatric: She has a normal mood and affect.    Results for orders placed or performed during the hospital encounter of 02/25/15 (from the past 24 hour(s))  Urinalysis, Routine w reflex microscopic (not at [facility])     Status: Abnormal   Collection Time: 02/25/15  9:15 PM  Result Value Ref Range   Color, Urine YELLOW YELLOW   APPearance CLEAR  CLEAR   Specific Gravity, Urine 1.015 1.005 - 1.030   pH 6.0 5.0 - 8.0   Glucose, UA NEGATIVE NEGATIVE mg/dL   Hgb urine dipstick LARGE (A) NEGATIVE   Bilirubin Urine NEGATIVE NEGATIVE   Ketones, ur 40 (A) NEGATIVE mg/dL   Protein, ur NEGATIVE NEGATIVE mg/dL   Nitrite NEGATIVE NEGATIVE   Leukocytes, UA NEGATIVE NEGATIVE  Urine microscopic-add on     Status: Abnormal   Collection Time: 02/25/15  9:15 PM  Result Value Ref Range   Squamous Epithelial / LPF 0-5 (A) NONE SEEN   WBC, UA 0-5 0 - 5 WBC/hpf   RBC / HPF 6-30 0 - 5 RBC/hpf   Bacteria, UA RARE (A) NONE SEEN  Pregnancy, urine POC     Status: Abnormal   Collection Time: 02/25/15 11:24 PM  Result Value Ref Range   Preg Test, Ur POSITIVE (A) NEGATIVE  CBC with Differential/Platelet     Status: Abnormal   Collection Time: 02/25/15 11:37 PM  Result Value Ref Range   WBC 10.1 4.0 - 10.5 K/uL   RBC 4.32 3.87 - 5.11 MIL/uL   Hemoglobin 12.5 12.0 - 15.0 g/dL   HCT 95.2 84.1 - 32.4 %   MCV 86.3 78.0 - 100.0 fL   MCH 28.9 26.0 - 34.0 pg   MCHC 33.5 30.0 - 36.0 g/dL   RDW 40.1 02.7 - 25.3 %   Platelets 192 150 - 400 K/uL   Neutrophils Relative % 80 %   Neutro Abs 8.0 (H) 1.7 - 7.7 K/uL   Lymphocytes Relative 15 %   Lymphs Abs 1.5 0.7 - 4.0 K/uL   Monocytes Relative 4 %   Monocytes Absolute 0.4 0.1 - 1.0 K/uL   Eosinophils Relative 1 %   Eosinophils Absolute 0.1 0.0 - 0.7 K/uL   Basophils Relative 0 %   Basophils Absolute 0.0 0.0 - 0.1 K/uL  hCG, quantitative, pregnancy     Status: Abnormal   Collection Time: 02/25/15 11:37 PM  Result Value Ref Range   hCG, Beta Chain, Quant, S 2188 (H) <5 mIU/mL  Rh IG workup (includes ABO/Rh)     Status: None (Preliminary result)   Collection Time: 02/25/15 11:37 PM  Result Value Ref Range   Gestational Age(Wks) 3    ABO/RH(D) O NEG    Antibody Screen NEG    Unit Number [id number]    Blood Component Type RHIG    Unit division 00    Status of Unit ISSUED    Transfusion Status OK  TO TRANSFUSE    US Ob Comp Less 14 Wks  02/26/2015  CLINICAL DATA:  Acute onset of left lower quadrant abdominal pain and vaginal bleeding. Initial encounter. EXAM: OBSTETRIC <14 WK [place] AND TRANSVAGINAL OB US TECHNIQUE: Both transabdominal and transvaginal ultrasound examinations were performed for complete evaluation of the gestation as well as the maternal uterus, adnexal regions, and pelvic cul-de-sac. Transvaginal technique was performed  to assess early pregnancy. COMPARISON:  None. FINDINGS: Intrauterine gestational sac: None seen. Yolk sac:  N/A Embryo:  N/A Subchorionic hemorrhage:  None visualized. Maternal uterus/adnexae: The uterus is unremarkable in appearance. A region of heterogeneous soft tissue is noted adjacent to the left ovary, measuring approximately 5.2 x 4.4 x 3.0 cm. Given moderate slightly complex free fluid within the pelvis, and left lower quadrant pain, this raises concern for ectopic pregnancy. The right ovary measures 2.5 x 2.1 x 2.2 cm, while the left ovary measures 2.5 x 2.3 x 1.6 cm. IMPRESSION: 1. Heterogeneous soft tissue noted adjacent to the left ovary, measuring 5.2 x 4.4 x 3.0 cm. Given moderate slightly complex free fluid within the pelvis, and left lower quadrant pain, this raises concern for ectopic pregnancy. Would correlate with the patient's quantitative beta HCG level. 2. No intrauterine gestational sac seen. These results were called by telephone at the time of interpretation on 02/26/2015 at 12:32 am to Dr. Vonzella Nipple, who verbally acknowledged these results. Electronically Signed   By: Roanna Raider M.D.   On: 02/26/2015 00:33   US Ob Transvaginal  02/26/2015  CLINICAL DATA:  Acute onset of left lower quadrant abdominal pain and vaginal bleeding. Initial encounter. EXAM: OBSTETRIC <14 WK [place] AND TRANSVAGINAL OB US TECHNIQUE: Both transabdominal and transvaginal ultrasound examinations were performed for complete evaluation of the gestation as well as the maternal  uterus, adnexal regions, and pelvic cul-de-sac. Transvaginal technique was performed to assess early pregnancy. COMPARISON:  None. FINDINGS: Intrauterine gestational sac: None seen. Yolk sac:  N/A Embryo:  N/A Subchorionic hemorrhage:  None visualized. Maternal uterus/adnexae: The uterus is unremarkable in appearance. A region of heterogeneous soft tissue is noted adjacent to the left ovary, measuring approximately 5.2 x 4.4 x 3.0 cm. Given moderate slightly complex free fluid within the pelvis, and left lower quadrant pain, this raises concern for ectopic pregnancy. The right ovary measures 2.5 x 2.1 x 2.2 cm, while the left ovary measures 2.5 x 2.3 x 1.6 cm. IMPRESSION: 1. Heterogeneous soft tissue noted adjacent to the left ovary, measuring 5.2 x 4.4 x 3.0 cm. Given moderate slightly complex free fluid within the pelvis, and left lower quadrant pain, this raises concern for ectopic pregnancy. Would correlate with the patient's quantitative beta HCG level. 2. No intrauterine gestational sac seen. These results were called by telephone at the time of interpretation on 02/26/2015 at 12:32 am to Dr. Vonzella Nipple, who verbally acknowledged these results. Electronically Signed   By: Roanna Raider M.D.   On: 02/26/2015 00:33     MAU Course  Procedures None  MDM +UPT UA, CBC, quant hCG and [place] today to rule out ectopic pregnancy O negative blood type in Epic from previous visit  Discussed patient with Dr. Ellyn Hack. She will come to MAU to see the patient. Plan will be for surgery this morning.  Patient will remain NPO. Last drank around 2330 and last ate at 2030 yesterday.  Dr. Ellyn Hack in MAU to evaluate patient.  Assessment and Plan  A: Left Ectopic pregnancy with moderate slightly complex free fluid, concerning for ectopic pregnancy  P: Patient to surgery with Dr. Elyse Hsu, PA-C  02/26/2015, 1:40 AM

## 2015-02-25 NOTE — MAU Note (Signed)
Pt reports severe LLQ pain x 2 days, some bright red vaginal bleeding.

## 2015-02-26 ENCOUNTER — Encounter (HOSPITAL_COMMUNITY)
Admission: AD | Disposition: A | Discharge status: Home / Self Care | Payer: Self-pay | Source: Ambulatory Visit | Attending: Obstetrics and Gynecology

## 2015-02-26 ENCOUNTER — Encounter (HOSPITAL_COMMUNITY): Payer: Self-pay | Admitting: Anesthesiology

## 2015-02-26 ENCOUNTER — Inpatient Hospital Stay (HOSPITAL_COMMUNITY): Payer: BLUE CROSS/BLUE SHIELD | Admitting: Anesthesiology

## 2015-02-26 DIAGNOSIS — O00109 Unspecified tubal pregnancy without intrauterine pregnancy: Secondary | ICD-10-CM

## 2015-02-26 HISTORY — DX: Unspecified tubal pregnancy without intrauterine pregnancy: O00.109

## 2015-02-26 HISTORY — PX: DIAGNOSTIC LAPAROSCOPY WITH REMOVAL OF ECTOPIC PREGNANCY: SHX6449

## 2015-02-26 LAB — HCG, QUANTITATIVE, PREGNANCY: hCG, Beta Chain, Quant, S: 2188 m[IU]/mL — ABNORMAL HIGH (ref <5)

## 2015-02-26 SURGERY — LAPAROSCOPY, WITH ECTOPIC PREGNANCY SURGICAL TREATMENT
Anesthesia: General | Site: Abdomen

## 2015-02-26 MED ORDER — SODIUM CHLORIDE 0.9 % IJ SOLN
INTRAMUSCULAR | Status: DC | PRN
  Administered 2015-02-26: 10 mL

## 2015-02-26 MED ORDER — LIDOCAINE HCL (CARDIAC) 20 MG/ML IV SOLN
INTRAVENOUS | Status: DC | PRN
  Administered 2015-02-26: 80 mg via INTRAVENOUS

## 2015-02-26 MED ORDER — PROMETHAZINE HCL 25 MG/ML IJ SOLN
INTRAMUSCULAR | Status: AC
  Administered 2015-02-26: 6.25 mg via INTRAVENOUS
  Filled 2015-02-26: qty 1

## 2015-02-26 MED ORDER — OXYCODONE-ACETAMINOPHEN 5-325 MG PO TABS
1.0000 | ORAL_TABLET | Freq: Four times a day (QID) | ORAL | Status: AC | PRN
Start: 2015-02-26 — End: ?

## 2015-02-26 MED ORDER — HYDROMORPHONE HCL 1 MG/ML IJ SOLN: 0.2500 mg | INTRAMUSCULAR | Status: DC | PRN

## 2015-02-26 MED ORDER — MIDAZOLAM HCL 2 MG/2ML IJ SOLN
INTRAMUSCULAR | Status: DC | PRN
  Administered 2015-02-26 (×2): 1 mg via INTRAVENOUS

## 2015-02-26 MED ORDER — PROPOFOL 10 MG/ML IV BOLUS
INTRAVENOUS | Status: AC
  Filled 2015-02-26: qty 20

## 2015-02-26 MED ORDER — PROMETHAZINE HCL 25 MG/ML IJ SOLN
6.2500 mg | INTRAMUSCULAR | Status: AC | PRN
  Administered 2015-02-26 (×2): 6.25 mg via INTRAVENOUS

## 2015-02-26 MED ORDER — CITRIC ACID-SODIUM CITRATE 334-500 MG/5ML PO SOLN
30.0000 mL | Freq: Once | ORAL | Status: AC
  Administered 2015-02-26: 30 mL via ORAL
  Filled 2015-02-26: qty 15

## 2015-02-26 MED ORDER — DEXAMETHASONE SODIUM PHOSPHATE 4 MG/ML IJ SOLN
INTRAMUSCULAR | Status: AC
  Filled 2015-02-26: qty 1

## 2015-02-26 MED ORDER — LACTATED RINGERS IV SOLN
INTRAVENOUS | Status: DC
  Administered 2015-02-26 (×2): via INTRAVENOUS
  Administered 2015-02-26: 125 mL/h via INTRAVENOUS
  Administered 2015-02-26 (×2): via INTRAVENOUS

## 2015-02-26 MED ORDER — NEOSTIGMINE METHYLSULFATE 10 MG/10ML IV SOLN
INTRAVENOUS | Status: AC
  Filled 2015-02-26: qty 1

## 2015-02-26 MED ORDER — PROPOFOL 10 MG/ML IV BOLUS
INTRAVENOUS | Status: DC | PRN
  Administered 2015-02-26: 250 mg via INTRAVENOUS

## 2015-02-26 MED ORDER — KETOROLAC TROMETHAMINE 30 MG/ML IJ SOLN: 30.0000 mg | Freq: Once | INTRAMUSCULAR | Status: DC

## 2015-02-26 MED ORDER — FENTANYL CITRATE (PF) 100 MCG/2ML IJ SOLN
INTRAMUSCULAR | Status: DC | PRN
  Administered 2015-02-26: 50 ug via INTRAVENOUS
  Administered 2015-02-26: 100 ug via INTRAVENOUS
  Administered 2015-02-26 (×2): 50 ug via INTRAVENOUS

## 2015-02-26 MED ORDER — FENTANYL CITRATE (PF) 250 MCG/5ML IJ SOLN
INTRAMUSCULAR | Status: AC
  Filled 2015-02-26: qty 5

## 2015-02-26 MED ORDER — RHO D IMMUNE GLOBULIN 1500 UNIT/2ML IJ SOSY
300.0000 ug | PREFILLED_SYRINGE | Freq: Once | INTRAMUSCULAR | Status: AC
  Administered 2015-02-26: 300 ug via INTRAMUSCULAR
  Filled 2015-02-26: qty 2

## 2015-02-26 MED ORDER — BUPIVACAINE HCL (PF) 0.25 % IJ SOLN
INTRAMUSCULAR | Status: DC | PRN
  Administered 2015-02-26: 10 mL

## 2015-02-26 MED ORDER — ONDANSETRON HCL 4 MG/2ML IJ SOLN
INTRAMUSCULAR | Status: AC
  Filled 2015-02-26: qty 2

## 2015-02-26 MED ORDER — ROCURONIUM BROMIDE 100 MG/10ML IV SOLN
INTRAVENOUS | Status: DC | PRN
  Administered 2015-02-26: 5 mg via INTRAVENOUS
  Administered 2015-02-26: 30 mg via INTRAVENOUS

## 2015-02-26 MED ORDER — DEXAMETHASONE SODIUM PHOSPHATE 4 MG/ML IJ SOLN
INTRAMUSCULAR | Status: DC | PRN
  Administered 2015-02-26: 4 mg via INTRAVENOUS

## 2015-02-26 MED ORDER — FAMOTIDINE IN NACL 20-0.9 MG/50ML-% IV SOLN
20.0000 mg | Freq: Once | INTRAVENOUS | Status: AC
  Administered 2015-02-26: 20 mg via INTRAVENOUS
  Filled 2015-02-26: qty 50

## 2015-02-26 MED ORDER — ONDANSETRON HCL 4 MG/2ML IJ SOLN
INTRAMUSCULAR | Status: DC | PRN
  Administered 2015-02-26: 4 mg via INTRAVENOUS

## 2015-02-26 MED ORDER — GLYCOPYRROLATE 0.2 MG/ML IJ SOLN
INTRAMUSCULAR | Status: AC
  Filled 2015-02-26: qty 3

## 2015-02-26 MED ORDER — NEOSTIGMINE METHYLSULFATE 10 MG/10ML IV SOLN
INTRAVENOUS | Status: DC | PRN
  Administered 2015-02-26: 1 mg via INTRAVENOUS

## 2015-02-26 MED ORDER — ROCURONIUM BROMIDE 100 MG/10ML IV SOLN
INTRAVENOUS | Status: AC
  Filled 2015-02-26: qty 1

## 2015-02-26 MED ORDER — IBUPROFEN 600 MG PO TABS: 600.0000 mg | ORAL_TABLET | Freq: Four times a day (QID) | ORAL | Status: AC | PRN

## 2015-02-26 MED ORDER — LACTATED RINGERS IR SOLN
Status: DC | PRN
  Administered 2015-02-26: 3000 mL

## 2015-02-26 MED ORDER — MIDAZOLAM HCL 2 MG/2ML IJ SOLN
INTRAMUSCULAR | Status: AC
  Filled 2015-02-26: qty 2

## 2015-02-26 MED ORDER — KETOROLAC TROMETHAMINE 30 MG/ML IJ SOLN
INTRAMUSCULAR | Status: AC
  Filled 2015-02-26: qty 1

## 2015-02-26 MED ORDER — GLYCOPYRROLATE 0.2 MG/ML IJ SOLN
INTRAMUSCULAR | Status: DC | PRN
  Administered 2015-02-26: 0.2 mg via INTRAVENOUS

## 2015-02-26 MED ORDER — LIDOCAINE HCL (CARDIAC) 20 MG/ML IV SOLN
INTRAVENOUS | Status: AC
  Filled 2015-02-26: qty 5

## 2015-02-26 MED ORDER — MEPERIDINE HCL 25 MG/ML IJ SOLN
6.2500 mg | INTRAMUSCULAR | Status: DC | PRN
Start: 2015-02-26 — End: 2015-02-26

## 2015-02-26 SURGICAL SUPPLY — 27 items
BAG SPEC RTRVL LRG 6X4 10 (ENDOMECHANICALS) ×1
CABLE HIGH FREQUENCY MONO STRZ (ELECTRODE) IMPLANT
CHLORAPREP W/TINT 26ML (MISCELLANEOUS) ×2 IMPLANT
CLOTH BEACON ORANGE TIMEOUT ST (SAFETY) ×2 IMPLANT
DRSG COVADERM PLUS 2X2 (GAUZE/BANDAGES/DRESSINGS) ×2 IMPLANT
DRSG OPSITE POSTOP 3X4 (GAUZE/BANDAGES/DRESSINGS) ×1 IMPLANT
GLOVE BIO SURGEON STRL SZ 6.5 (GLOVE) ×2 IMPLANT
GLOVE BIOGEL PI IND STRL 7.0 (GLOVE) ×1 IMPLANT
GLOVE BIOGEL PI INDICATOR 7.0 (GLOVE) ×3
GOWN STRL REUS W/TWL LRG LVL3 (GOWN DISPOSABLE) ×4 IMPLANT
LIQUID BAND (GAUZE/BANDAGES/DRESSINGS) ×2 IMPLANT
NEEDLE INSUFFLATION 120MM (ENDOMECHANICALS) ×2 IMPLANT
NS IRRIG 1000ML POUR BTL (IV SOLUTION) ×1 IMPLANT
PACK LAPAROSCOPY BASIN (CUSTOM PROCEDURE TRAY) ×2 IMPLANT
PAD TRENDELENBURG POSITION (MISCELLANEOUS) ×2 IMPLANT
POUCH SPECIMEN RETRIEVAL 10MM (ENDOMECHANICALS) ×1 IMPLANT
SET IRRIG TUBING LAPAROSCOPIC (IRRIGATION / IRRIGATOR) ×1 IMPLANT
SHEARS HARMONIC ACE PLUS 36CM (ENDOMECHANICALS) ×1 IMPLANT
SLEEVE XCEL OPT CAN 5 100 (ENDOMECHANICALS) ×2 IMPLANT
SUT VICRYL 0 UR6 27IN ABS (SUTURE) ×1 IMPLANT
SUT VICRYL 4-0 PS2 18IN ABS (SUTURE) ×3 IMPLANT
TOWEL OR 17X24 6PK STRL BLUE (TOWEL DISPOSABLE) ×4 IMPLANT
TRAY FOLEY BAG SILVER LF 14FR (CATHETERS) ×2 IMPLANT
TROCAR XCEL NON-BLD 11X100MML (ENDOMECHANICALS) ×1 IMPLANT
TROCAR XCEL NON-BLD 5MMX100MML (ENDOMECHANICALS) ×1 IMPLANT
WARMER LAPAROSCOPE (MISCELLANEOUS) ×2 IMPLANT
WATER STERILE IRR 1000ML POUR (IV SOLUTION) ×1 IMPLANT

## 2015-02-26 NOTE — Anesthesia Procedure Notes (Signed)
Procedure Name: Intubation Date/Time: 02/26/2015 5:28 AM Performed by: Collier Flowers Pre-anesthesia Checklist: Patient identified, Emergency Drugs available, Suction available, Patient being monitored and Timeout performed Patient Re-evaluated:Patient Re-evaluated prior to inductionOxygen Delivery Method: Circle system utilized Preoxygenation: Pre-oxygenation with 100% oxygen Intubation Type: Cricoid Pressure applied and IV induction Ventilation: Mask ventilation without difficulty Laryngoscope Size: Mac and 3 Grade View: Grade I Tube type: Oral Tube size: 7.0 mm Number of attempts: 1 Airway Equipment and Method: Stylet Placement Confirmation: ETT inserted through vocal cords under direct vision,  positive ETCO2 and breath sounds checked- equal and bilateral Secured at: 20 (at lips) cm Tube secured with: Tape Dental Injury: Teeth and Oropharynx as per pre-operative assessment

## 2015-02-26 NOTE — Op Note (Signed)
NAME:  Amy Rios, Amy Rios NO.:  [id number]  MEDICAL RECORD NO.:  [id number]  LOCATION:  [facility]                          FACILITY:  [facility]  PHYSICIAN:  Sherron Monday, MD        DATE OF BIRTH:  1985/11/02  DATE OF PROCEDURE:  02/26/2015 DATE OF DISCHARGE:                              OPERATIVE REPORT   PREOPERATIVE DIAGNOSIS:  Ectopic pregnancy, left tube.  POSTOPERATIVE DIAGNOSIS:  Ectopic pregnancy, left tube.  PROCEDURE:  Operative laparoscopy with left salpingectomy and removal of ectopic pregnancy as well as evacuation of hemoperitoneum.  SURGEON:  Sherron Monday, MD.  ASSISTANT:  None.  ANESTHESIA:  Local and general.  IV FLUIDS:  1400 mL.  URINE OUTPUT:  450 mL, clear urine at the end of the procedure.  EBL:  150 mL.  PATHOLOGY:  Left tube and ectopic pregnancy.  COMPLICATIONS:  None.  PROCEDURE:  After informed consent was reviewed with the patient and her husband, she was transported to the operating room, placed on the table in supine position.  Using an open-sided speculum, a Hulka tenaculum was placed in her uterus.  A Foley catheter was also placed in her bladder. Gloves and gown were changed.  Attention was turned to the abdominal portion of the case and approximately 1 mm infraumbilical incision horizontally was made.  Using a Veress needle, a pneumoperitoneum was obtained after the hanging drop test was passed.  The 10 mm trocar was placed under direct visualization.  Accessory 5 mm ports were placed about the right and left under direct visualization.  The visualization of the pelvis revealed an ectopic pregnancy extruding from the left tube  as well as approximately 50 mL hemoperitoneum.  The tube was grasped with a million-dollar grasper and excised using the Harmonic scalpel.  This tube and ectopic pregnancy was then placed in an EndoCatch bag and removed.  A pelvic survey was performed and the sites were noted to be hemostatic.  The blood  was drained from the upper abdomen to the lower abdomen and suction irrigation was performed to remove this blood. Normal liver edge was seen, the gallbladder was not, and the right tube and ovary appeared normal as did the left ovary.  The accessory ports were removed under direct visualization and the umbilical port was removed after gas was evacuated.  The umbilical port was closed with a deep suture of 0 Vicryl and sutures of 4-0 Vicryl were used in each port to close subcuticularly and Dermabond was applied.  The patient tolerated the procedure well.  Sponge, lap, and needle count were correct x2 per the operating staff.     Sherron Monday, MD     JB/MEDQ  D:  02/26/2015  T:  02/26/2015  Job:  [id number]

## 2015-02-26 NOTE — Brief Op Note (Signed)
02/25/2015 - 02/26/2015  6:39 AM  PATIENT:  Amy Rios  30 y.o. female  PRE-OPERATIVE DIAGNOSIS:  Ectopic Pregnancy - Left  POST-OPERATIVE DIAGNOSIS:  Ectopic Pregnancy - Left  PROCEDURE:  Procedure(s): Operative Laparoscopy Left Salpingectomy with Removal Ectopic Pregnancy, Evacuation Hemoperitoneum (N/A)  SURGEON:  Surgeon(s) and Role:    * Sherian Rein, MD - Primary  ANESTHESIA:   local and general  EBL:  Total I/O In: 1400 [I.V.:1400] Out: 600 [Urine:450; Blood:150]  BLOOD ADMINISTERED:none  DRAINS: none   LOCAL MEDICATIONS USED:  MARCAINE    and Amount: 10 ml  SPECIMEN:  Source of Specimen:  Left tube and ectopic pregnancy  DISPOSITION OF SPECIMEN:  PATHOLOGY  COUNTS:  YES  TOURNIQUET:  * No tourniquets in log *  DICTATION: .Other Dictation: Dictation Number [id number]  PLAN OF CARE: Discharge to home after PACU  PATIENT DISPOSITION:  PACU - hemodynamically stable.   Delay start of Pharmacological VTE agent (>24hrs) due to surgical blood loss or risk of bleeding: not applicable

## 2015-02-26 NOTE — Discharge Instructions (Signed)
DISCHARGE INSTRUCTIONS: Laparoscopy  The following instructions have been prepared to help you care for yourself upon your return home today.  Wound care:  Do not get the incision wet for the first 24 hours. The incision should be kept clean and dry.  The Band-Aids or dressings may be removed the day after surgery.  Should the incision become sore, red, and swollen after the first week, check with your doctor.  Personal hygiene:  Shower the day after your procedure.  Activity and limitations:  Do NOT drive or operate any equipment today.  Do NOT lift anything more than 15 pounds for 2-3 weeks after surgery.  Do NOT rest in bed all day.  Walking is encouraged. Walk each day, starting slowly with 5-minute walks 3 or 4 times a day. Slowly increase the length of your walks.  Walk up and down stairs slowly.  Do NOT do strenuous activities, such as golfing, playing tennis, bowling, running, biking, weight lifting, gardening, mowing, or vacuuming for 2-4 weeks. Ask your doctor when it is okay to start.  Diet: Eat a light meal as desired this evening. You may resume your usual diet tomorrow.  Return to work: This is dependent on the type of work you do. For the most part you can return to a desk job within a week of surgery. If you are more active at work, please discuss this with your doctor.  What to expect after your surgery: You may have a slight burning sensation when you urinate on the first day. You may have a very small amount of blood in the urine. Expect to have a small amount of vaginal discharge/light bleeding for 1-2 weeks. It is not unusual to have abdominal soreness and bruising for up to 2 weeks. You may be tired and need more rest for about 1 week. You may experience shoulder pain for 24-72 hours. Lying flat in bed may relieve it.  Call your doctor for any of the following:  Develop a fever of 100.4 or greater  Inability to urinate 6 hours after discharge from hospital  Severe  pain not relieved by pain medications  Persistent of heavy bleeding at incision site  Redness or swelling around incision site after a week  Increasing nausea or vomiting  Patient Signature________________________________________ Nurse Signature_________________________________________ Post Anesthesia Home Care Instructions  Activity: Get plenty of rest for the remainder of the day. A responsible adult should stay with you for 24 hours following the procedure.  For the next 24 hours, DO NOT: -Drive a car -Operate machinery -Drink alcoholic beverages -Take any medication unless instructed by your physician -Make any legal decisions or sign important papers.  Meals: Start with liquid foods such as gelatin or soup. Progress to regular foods as tolerated. Avoid greasy, spicy, heavy foods. If nausea and/or vomiting occur, drink only clear liquids until the nausea and/or vomiting subsides. Call your physician if vomiting continues.  Special Instructions/Symptoms: Your throat may feel dry or sore from the anesthesia or the breathing tube placed in your throat during surgery. If this causes discomfort, gargle with warm salt water. The discomfort should disappear within 24 hours.  If you had a scopolamine patch placed behind your ear for the management of post- operative nausea and/or vomiting:  1. The medication in the patch is effective for 72 hours, after which it should be removed.  Wrap patch in a tissue and discard in the trash. Wash hands thoroughly with soap and water. 2. You may remove the patch   earlier than 72 hours if you experience unpleasant side effects which may include dry mouth, dizziness or visual disturbances. 3. Avoid touching the patch. Wash your hands with soap and water after contact with the patch.   

## 2015-02-26 NOTE — Anesthesia Postprocedure Evaluation (Signed)
Anesthesia Post Note  Patient: Amy Rios  Procedure(s) Performed: Procedure(s) (LRB): Operative Laparoscopy Left Salpingectomy with Removal Ectopic Pregnancy, Evacuation Hemoperitoneum (N/A)  Patient location during evaluation: PACU Anesthesia Type: General Level of consciousness: sedated Pain management: pain level controlled Vital Signs Assessment: post-procedure vital signs reviewed and stable Respiratory status: spontaneous breathing and respiratory function stable Cardiovascular status: stable Anesthetic complications: no    Last Vitals:  Filed Vitals:   02/26/15 0715 02/26/15 0730  BP: 108/64 117/57  Pulse: 80 87  Temp:    Resp: 19 17    Last Pain:  Filed Vitals:   02/26/15 0737  PainSc: 0-No pain                 Shakeia Krus DANIEL

## 2015-02-26 NOTE — Transfer of Care (Signed)
Immediate Anesthesia Transfer of Care Note  Patient: Amy Rios  Procedure(s) Performed: Procedure(s): Operative Laparoscopy Left Salpingectomy with Removal Ectopic Pregnancy, Evacuation Hemoperitoneum (N/A)  Patient Location: PACU  Anesthesia Type:General  Level of Consciousness: awake and patient cooperative  Airway & Oxygen Therapy: Patient Spontanous Breathing and Patient connected to nasal cannula oxygen  Post-op Assessment: Report given to RN and Post -op Vital signs reviewed and stable  Post vital signs: Reviewed and stable  Last Vitals:  TEMP 98.7 BP 113/60 HR 86 RR 17 POX 100  Complications: No apparent anesthesia complications

## 2015-02-26 NOTE — Anesthesia Preprocedure Evaluation (Signed)
Anesthesia Evaluation  Patient identified by MRN, date of birth, ID band Patient awake    Reviewed: Allergy & Precautions, H&P , NPO status , Patient's Chart, lab work & pertinent test results  Airway Mallampati: I  TM Distance: >3 FB Neck ROM: full    Dental no notable dental hx. (+) Teeth Intact   Pulmonary neg pulmonary ROS,    Pulmonary exam normal        Cardiovascular negative cardio ROS Normal cardiovascular exam     Neuro/Psych negative neurological ROS  negative psych ROS   GI/Hepatic negative GI ROS, Neg liver ROS,   Endo/Other  negative endocrine ROS  Renal/GU negative Renal ROS     Musculoskeletal   Abdominal Normal abdominal exam  (+)   Peds  Hematology negative hematology ROS (+)   Anesthesia Other Findings   Reproductive/Obstetrics negative OB ROS                            Anesthesia Physical Anesthesia Plan  ASA: I  Anesthesia Plan: General   Post-op Pain Management:    Induction: Intravenous  Airway Management Planned: Oral ETT  Additional Equipment:   Intra-op Plan:   Post-operative Plan: Extubation in OR  Informed Consent: I have reviewed the patients History and Physical, chart, labs and discussed the procedure including the risks, benefits and alternatives for the proposed anesthesia with the patient or authorized representative who has indicated his/her understanding and acceptance.   Dental Advisory Given  Plan Discussed with: CRNA and Surgeon  Anesthesia Plan Comments:         Anesthesia Quick Evaluation  

## 2015-02-26 NOTE — H&P (Signed)
Amy Rios is an 30 y.o. female [phone] with ectopic pregnancy.  LLQ pain and complex fluid on pelvic US.  HCG = 2188.  Uterus is empty.  D/W pt r/b/a of L/S salpingostomy vs salpingectomy.    Pertinent Gynecological History: [id number] G1&2, G4 SVD, some PTL, but TSVD x 3 G3 SAB G5 present - ectopic  No abn pap No STD  Menstrual History:  Patient's last menstrual period was 02/06/2015.    Past Medical History  Diagnosis Date  . No pertinent past medical history   . Medical history non-contributory     Past Surgical History  Procedure Laterality Date  . Wisdom tooth extraction      Family History  Problem Relation Age of Onset  . Cancer Mother   . Hypertension Maternal Grandmother     Social History:  reports that she has never smoked. She has never used smokeless tobacco. She reports that she does not drink alcohol or use illicit drugs.married  Allergies: No Known Allergies  Prescriptions prior to admission  Medication Sig Dispense Refill Last Dose  . acetaminophen (TYLENOL) 500 MG tablet Take 500 mg by mouth every 6 (six) hours as needed for headache.   02/25/2015 at 1830  . ibuprofen (ADVIL,MOTRIN) 600 MG tablet Take 1 tablet (600 mg total) by mouth every 6 (six) hours. 30 tablet 0 02/25/2015 at 2200  . Prenatal Vit-Fe Fumarate-FA (PRENATAL MULTIVITAMIN) TABS tablet Take 1 tablet by mouth daily.    02/25/2015 at Unknown time    Review of Systems  Constitutional: Negative.   HENT: Negative.   Eyes: Negative.   Respiratory: Negative.   Cardiovascular: Negative.   Gastrointestinal: Positive for abdominal pain.  Genitourinary: Negative.   Musculoskeletal: Negative.   Skin: Negative.   Neurological: Negative.   Psychiatric/Behavioral: Negative.     Blood pressure 115/77, pulse 92, temperature 98.3 F (36.8 C), temperature source Oral, resp. rate 20, height 5\' 2"  (1.575 m), weight 51.256 kg (113 lb), last menstrual period 02/06/2015, SpO2 100 %, unknown if currently  breastfeeding. Physical Exam  Constitutional: She is oriented to person, place, and time. She appears well-developed and well-nourished.  HENT:  Head: Normocephalic and atraumatic.  Cardiovascular: Normal rate and regular rhythm.   Respiratory: Effort normal and breath sounds normal. No respiratory distress. She has no wheezes.  GI: Soft. Bowel sounds are normal. She exhibits no distension. There is tenderness.  Musculoskeletal: Normal range of motion.  Neurological: She is alert and oriented to person, place, and time.  Skin: Skin is warm and dry.  Psychiatric: She has a normal mood and affect. Her behavior is normal.    Results for orders placed or performed during the hospital encounter of 02/25/15 (from the past 24 hour(s))  Urinalysis, Routine w reflex microscopic (not at [facility])     Status: Abnormal   Collection Time: 02/25/15  9:15 PM  Result Value Ref Range   Color, Urine YELLOW YELLOW   APPearance CLEAR CLEAR   Specific Gravity, Urine 1.015 1.005 - 1.030   pH 6.0 5.0 - 8.0   Glucose, UA NEGATIVE NEGATIVE mg/dL   Hgb urine dipstick LARGE (A) NEGATIVE   Bilirubin Urine NEGATIVE NEGATIVE   Ketones, ur 40 (A) NEGATIVE mg/dL   Protein, ur NEGATIVE NEGATIVE mg/dL   Nitrite NEGATIVE NEGATIVE   Leukocytes, UA NEGATIVE NEGATIVE  Urine microscopic-add on     Status: Abnormal   Collection Time: 02/25/15  9:15 PM  Result Value Ref Range   Squamous Epithelial / LPF 0-5 (  A) NONE SEEN   WBC, UA 0-5 0 - 5 WBC/hpf   RBC / HPF 6-30 0 - 5 RBC/hpf   Bacteria, UA RARE (A) NONE SEEN  Pregnancy, urine POC     Status: Abnormal   Collection Time: 02/25/15 11:24 PM  Result Value Ref Range   Preg Test, Ur POSITIVE (A) NEGATIVE  CBC with Differential/Platelet     Status: Abnormal   Collection Time: 02/25/15 11:37 PM  Result Value Ref Range   WBC 10.1 4.0 - 10.5 K/uL   RBC 4.32 3.87 - 5.11 MIL/uL   Hemoglobin 12.5 12.0 - 15.0 g/dL   HCT 96.2 95.2 - 84.1 %   MCV 86.3 78.0 - 100.0 fL   MCH  28.9 26.0 - 34.0 pg   MCHC 33.5 30.0 - 36.0 g/dL   RDW 32.4 40.1 - 02.7 %   Platelets 192 150 - 400 K/uL   Neutrophils Relative % 80 %   Neutro Abs 8.0 (H) 1.7 - 7.7 K/uL   Lymphocytes Relative 15 %   Lymphs Abs 1.5 0.7 - 4.0 K/uL   Monocytes Relative 4 %   Monocytes Absolute 0.4 0.1 - 1.0 K/uL   Eosinophils Relative 1 %   Eosinophils Absolute 0.1 0.0 - 0.7 K/uL   Basophils Relative 0 %   Basophils Absolute 0.0 0.0 - 0.1 K/uL  hCG, quantitative, pregnancy     Status: Abnormal   Collection Time: 02/25/15 11:37 PM  Result Value Ref Range   hCG, Beta Chain, Quant, S 2188 (H) <5 mIU/mL  Rh IG workup (includes ABO/Rh)     Status: None (Preliminary result)   Collection Time: 02/25/15 11:37 PM  Result Value Ref Range   Gestational Age(Wks) 3    ABO/RH(D) O NEG    Antibody Screen NEG    Unit Number [id number]    Blood Component Type RHIG    Unit division 00    Status of Unit ISSUED    Transfusion Status OK TO TRANSFUSE     US Ob Comp Less 14 Wks  02/26/2015  CLINICAL DATA:  Acute onset of left lower quadrant abdominal pain and vaginal bleeding. Initial encounter. EXAM: OBSTETRIC <14 WK [place] AND TRANSVAGINAL OB US TECHNIQUE: Both transabdominal and transvaginal ultrasound examinations were performed for complete evaluation of the gestation as well as the maternal uterus, adnexal regions, and pelvic cul-de-sac. Transvaginal technique was performed to assess early pregnancy. COMPARISON:  None. FINDINGS: Intrauterine gestational sac: None seen. Yolk sac:  N/A Embryo:  N/A Subchorionic hemorrhage:  None visualized. Maternal uterus/adnexae: The uterus is unremarkable in appearance. A region of heterogeneous soft tissue is noted adjacent to the left ovary, measuring approximately 5.2 x 4.4 x 3.0 cm. Given moderate slightly complex free fluid within the pelvis, and left lower quadrant pain, this raises concern for ectopic pregnancy. The right ovary measures 2.5 x 2.1 x 2.2 cm, while the left ovary  measures 2.5 x 2.3 x 1.6 cm. IMPRESSION: 1. Heterogeneous soft tissue noted adjacent to the left ovary, measuring 5.2 x 4.4 x 3.0 cm. Given moderate slightly complex free fluid within the pelvis, and left lower quadrant pain, this raises concern for ectopic pregnancy. Would correlate with the patient's quantitative beta HCG level. 2. No intrauterine gestational sac seen. These results were called by telephone at the time of interpretation on 02/26/2015 at 12:32 am to Dr. Vonzella Nipple, who verbally acknowledged these results. Electronically Signed   By: Roanna Raider M.D.   On: 02/26/2015 00:33  US Ob Transvaginal  02/26/2015  CLINICAL DATA:  Acute onset of left lower quadrant abdominal pain and vaginal bleeding. Initial encounter. EXAM: OBSTETRIC <14 WK [place] AND TRANSVAGINAL OB US TECHNIQUE: Both transabdominal and transvaginal ultrasound examinations were performed for complete evaluation of the gestation as well as the maternal uterus, adnexal regions, and pelvic cul-de-sac. Transvaginal technique was performed to assess early pregnancy. COMPARISON:  None. FINDINGS: Intrauterine gestational sac: None seen. Yolk sac:  N/A Embryo:  N/A Subchorionic hemorrhage:  None visualized. Maternal uterus/adnexae: The uterus is unremarkable in appearance. A region of heterogeneous soft tissue is noted adjacent to the left ovary, measuring approximately 5.2 x 4.4 x 3.0 cm. Given moderate slightly complex free fluid within the pelvis, and left lower quadrant pain, this raises concern for ectopic pregnancy. The right ovary measures 2.5 x 2.1 x 2.2 cm, while the left ovary measures 2.5 x 2.3 x 1.6 cm. IMPRESSION: 1. Heterogeneous soft tissue noted adjacent to the left ovary, measuring 5.2 x 4.4 x 3.0 cm. Given moderate slightly complex free fluid within the pelvis, and left lower quadrant pain, this raises concern for ectopic pregnancy. Would correlate with the patient's quantitative beta HCG level. 2. No intrauterine gestational  sac seen. These results were called by telephone at the time of interpretation on 02/26/2015 at 12:32 am to Dr. Vonzella Nipple, who verbally acknowledged these results. Electronically Signed   By: Roanna Raider M.D.   On: 02/26/2015 00:33    Assessment/Plan: 29yo [id number] with ectopic for L/S salpingostomy vs ectomy D/w pt r/b/a of surgery  Bovard-Stuckert, Suyash Amory 02/26/2015, 2:35 AM

## 2015-02-27 ENCOUNTER — Encounter (HOSPITAL_COMMUNITY): Payer: Self-pay | Admitting: Obstetrics and Gynecology

## 2015-02-27 LAB — RH IG WORKUP (INCLUDES ABO/RH)
ABO/RH(D): O NEG
Antibody Screen: NEGATIVE
GESTATIONAL AGE(WKS): 3
UNIT DIVISION: 0

## 2015-05-08 DIAGNOSIS — L409 Psoriasis, unspecified: Secondary | ICD-10-CM | POA: Diagnosis not present

## 2015-05-08 DIAGNOSIS — B359 Dermatophytosis, unspecified: Secondary | ICD-10-CM | POA: Diagnosis not present

## 2015-09-12 DIAGNOSIS — Z3201 Encounter for pregnancy test, result positive: Secondary | ICD-10-CM | POA: Diagnosis not present

## 2015-09-16 DIAGNOSIS — Z3201 Encounter for pregnancy test, result positive: Secondary | ICD-10-CM | POA: Diagnosis not present

## 2015-09-24 DIAGNOSIS — Z3A01 Less than 8 weeks gestation of pregnancy: Secondary | ICD-10-CM | POA: Diagnosis not present

## 2015-09-24 DIAGNOSIS — O26891 Other specified pregnancy related conditions, first trimester: Secondary | ICD-10-CM | POA: Diagnosis not present

## 2015-10-08 DIAGNOSIS — O365931 Maternal care for other known or suspected poor fetal growth, third trimester, fetus 1: Secondary | ICD-10-CM | POA: Diagnosis not present

## 2015-10-08 DIAGNOSIS — Z3A01 Less than 8 weeks gestation of pregnancy: Secondary | ICD-10-CM | POA: Diagnosis not present

## 2015-10-08 DIAGNOSIS — O26891 Other specified pregnancy related conditions, first trimester: Secondary | ICD-10-CM | POA: Diagnosis not present

## 2015-12-03 DIAGNOSIS — Z3689 Encounter for other specified antenatal screening: Secondary | ICD-10-CM | POA: Diagnosis not present

## 2015-12-26 DIAGNOSIS — Z363 Encounter for antenatal screening for malformations: Secondary | ICD-10-CM | POA: Diagnosis not present

## 2015-12-26 DIAGNOSIS — Z0375 Encounter for suspected cervical shortening ruled out: Secondary | ICD-10-CM | POA: Diagnosis not present

## 2015-12-31 ENCOUNTER — Encounter (HOSPITAL_COMMUNITY): Payer: Self-pay

## 2016-03-04 DIAGNOSIS — Z3689 Encounter for other specified antenatal screening: Secondary | ICD-10-CM | POA: Diagnosis not present

## 2016-03-04 DIAGNOSIS — Z6791 Unspecified blood type, Rh negative: Secondary | ICD-10-CM | POA: Diagnosis not present

## 2016-04-18 DIAGNOSIS — Z3689 Encounter for other specified antenatal screening: Secondary | ICD-10-CM | POA: Diagnosis not present

## 2016-07-30 DIAGNOSIS — Z13220 Encounter for screening for lipoid disorders: Secondary | ICD-10-CM | POA: Diagnosis not present

## 2016-07-30 DIAGNOSIS — Z01419 Encounter for gynecological examination (general) (routine) without abnormal findings: Secondary | ICD-10-CM | POA: Diagnosis not present

## 2016-09-09 DIAGNOSIS — M62838 Other muscle spasm: Secondary | ICD-10-CM | POA: Diagnosis not present

## 2016-09-09 DIAGNOSIS — M25511 Pain in right shoulder: Secondary | ICD-10-CM | POA: Diagnosis not present

## 2016-09-09 DIAGNOSIS — M9901 Segmental and somatic dysfunction of cervical region: Secondary | ICD-10-CM | POA: Diagnosis not present

## 2016-09-09 DIAGNOSIS — M9903 Segmental and somatic dysfunction of lumbar region: Secondary | ICD-10-CM | POA: Diagnosis not present

## 2016-10-06 DIAGNOSIS — R945 Abnormal results of liver function studies: Secondary | ICD-10-CM | POA: Diagnosis not present

## 2017-09-28 DIAGNOSIS — Z124 Encounter for screening for malignant neoplasm of cervix: Secondary | ICD-10-CM | POA: Diagnosis not present

## 2017-09-28 DIAGNOSIS — Z01419 Encounter for gynecological examination (general) (routine) without abnormal findings: Secondary | ICD-10-CM | POA: Diagnosis not present

## 2017-09-28 DIAGNOSIS — Z1389 Encounter for screening for other disorder: Secondary | ICD-10-CM | POA: Diagnosis not present

## 2017-09-28 DIAGNOSIS — Z6820 Body mass index (BMI) 20.0-20.9, adult: Secondary | ICD-10-CM | POA: Diagnosis not present

## 2017-09-29 DIAGNOSIS — Z124 Encounter for screening for malignant neoplasm of cervix: Secondary | ICD-10-CM | POA: Diagnosis not present

## 2017-10-18 ENCOUNTER — Emergency Department (HOSPITAL_COMMUNITY): Payer: BLUE CROSS/BLUE SHIELD

## 2017-10-18 ENCOUNTER — Encounter (HOSPITAL_COMMUNITY): Payer: Self-pay

## 2017-10-18 ENCOUNTER — Emergency Department (HOSPITAL_COMMUNITY)
Admission: EM | Admit: 2017-10-18 | Discharge: 2017-10-18 | Disposition: A | Discharge status: Home / Self Care | Payer: BLUE CROSS/BLUE SHIELD | Attending: Emergency Medicine | Admitting: Emergency Medicine

## 2017-10-18 ENCOUNTER — Other Ambulatory Visit: Payer: Self-pay

## 2017-10-18 DIAGNOSIS — R5381 Other malaise: Secondary | ICD-10-CM | POA: Diagnosis not present

## 2017-10-18 DIAGNOSIS — R079 Chest pain, unspecified: Secondary | ICD-10-CM | POA: Diagnosis not present

## 2017-10-18 DIAGNOSIS — Z79899 Other long term (current) drug therapy: Secondary | ICD-10-CM | POA: Diagnosis not present

## 2017-10-18 HISTORY — DX: Complete or unspecified spontaneous abortion without complication: O03.9

## 2017-10-18 LAB — I-STAT TROPONIN, ED: TROPONIN I, POC: 0 ng/mL (ref 0.00–0.08)

## 2017-10-18 LAB — BASIC METABOLIC PANEL
Anion gap: 10 (ref 5–15)
BUN: 9 mg/dL (ref 6–20)
CALCIUM: 9.6 mg/dL (ref 8.9–10.3)
CHLORIDE: 111 mmol/L (ref 98–111)
CO2: 22 mmol/L (ref 22–32)
CREATININE: 0.52 mg/dL (ref 0.44–1.00)
GFR calc Af Amer: >60 mL/min (ref >60)
GFR calc non Af Amer: >60 mL/min (ref >60)
Glucose, Bld: 112 mg/dL — ABNORMAL HIGH (ref 70–99)
Potassium: 4 mmol/L (ref 3.5–5.1)
SODIUM: 143 mmol/L (ref 135–145)

## 2017-10-18 LAB — I-STAT BETA HCG BLOOD, ED (MC, WL, AP ONLY)

## 2017-10-18 LAB — CBC
HCT: 40.7 % (ref 36.0–46.0)
Hemoglobin: 13.1 g/dL (ref 12.0–15.0)
MCH: 29.3 pg (ref 26.0–34.0)
MCHC: 32.2 g/dL (ref 30.0–36.0)
MCV: 91.1 fL (ref 80.0–100.0)
PLATELETS: 158 10*3/uL (ref 150–400)
RBC: 4.47 MIL/uL (ref 3.87–5.11)
RDW: 12.6 % (ref 11.5–15.5)
WBC: 5.3 10*3/uL (ref 4.0–10.5)
nRBC: 0 % (ref 0.0–0.2)

## 2017-10-18 LAB — TSH: TSH: 1.428 u[IU]/mL (ref 0.350–4.500)

## 2017-10-18 NOTE — ED Notes (Signed)
ED Provider at bedside. 

## 2017-10-18 NOTE — ED Notes (Signed)
Patient transported to X-ray 

## 2017-10-18 NOTE — ED Provider Notes (Signed)
[facility]-EMERGENCY DEPT Provider Note   CSN: [id number] Arrival date & time: 10/18/17  4098     History   Chief Complaint Chief Complaint  Patient presents with  . Chest Pain    HPI Amy Rios is a 32 y.o. female.  HPI   She is here for evaluation of a sensation of "chest pressure", which started several days ago and is fairly constant.  She has previously had intermittent episodes but never this long.  She has a sensation of ill ease, and is worried that she is unable to care for her children at home like this.  She has 4 children, less than 15 years old who she cares for with her husband.  Her husband is supportive.  She had a very relaxing weekend and is worried that the symptoms persisted during that time when she was relaxing.  She reports a history of anxiety but never had symptoms like this.  She does not use oral contraceptives.  She stopped breast-feeding 6 months ago.  He does not smoke cigarettes, or use illegal drugs.  There are no other known modifying factors.   Past Medical History:  Diagnosis Date  . Medical history non-contributory   . Miscarriage   . No pertinent past medical history   . Tubal ectopic pregnancy 02/26/2015    Patient Active Problem List   Diagnosis Date Noted  . Tubal ectopic pregnancy 02/26/2015  . Pregnant 09/19/2014  . Precipitous delivery 09/19/2014  . Preterm labor 08/25/2012  . Active labor 08/02/2010    Past Surgical History:  Procedure Laterality Date  . DIAGNOSTIC LAPAROSCOPY WITH REMOVAL OF ECTOPIC PREGNANCY N/A 02/26/2015   Procedure: Operative Laparoscopy Left Salpingectomy with Removal Ectopic Pregnancy, Evacuation Hemoperitoneum;  Surgeon: Sherian Rein, MD;  Location: [facility];  Service: Gynecology;  Laterality: N/A;  . WISDOM TOOTH EXTRACTION       OB History    Gravida  5   Para  3   Term  3   Preterm      AB  1   Living  3     SAB  1   TAB      Ectopic      Multiple  0    Live Births  3            Home Medications    Prior to Admission medications   Medication Sig Start Date End Date Taking? Authorizing Provider  Cyanocobalamin (VITAMIN B 12 PO) Take 1 tablet by mouth daily.   Yes [provider]  EVENING PRIMROSE OIL PO Take 1 tablet by mouth daily.   Yes [provider]  ibuprofen (ADVIL,MOTRIN) 200 MG tablet Take 400 mg by mouth daily as needed (pain).   Yes [provider]  loratadine-pseudoephedrine (CLARITIN-D 24-HOUR) 10-240 MG 24 hr tablet Take 1 tablet by mouth daily.   Yes [provider]  Multiple Vitamin (MULTIVITAMIN WITH MINERALS) TABS tablet Take 1 tablet by mouth daily.   Yes [provider]  PREBIOTIC PRODUCT PO Take 1 tablet by mouth daily.   Yes [provider]  Probiotic Product (PROBIOTIC PO) Take 1 tablet by mouth daily.   Yes [provider]  Pseudoephedrine-guaiFENesin [facility] D PO) Take 2 tablets by mouth daily as needed (cold symptoms).   Yes [provider]  ibuprofen (ADVIL,MOTRIN) 600 MG tablet Take 1 tablet (600 mg total) by mouth every 6 (six) hours as needed for moderate pain. Patient not taking: Reported on 10/18/2017  02/26/15   Bovard-Stuckert, Augusto Gamble, MD  oxyCODONE-acetaminophen (ROXICET) 5-325 MG tablet Take 1-2 tablets by mouth every 6 (six) hours as needed for severe pain. Patient not taking: Reported on 10/18/2017 02/26/15   Sherian Rein, MD    Family History Family History  Problem Relation Age of Onset  . Cancer Mother   . Hypertension Maternal Grandmother     Social History Social History   Tobacco Use  . Smoking status: Never Smoker  . Smokeless tobacco: Never Used  Substance Use Topics  . Alcohol use: No  . Drug use: No     Allergies   Patient has no known allergies.   Review of Systems Review of Systems  All other systems reviewed and are negative.    Physical Exam Updated Vital Signs BP 109/71 Comment:  Simultaneous filing. User may not have seen previous data.  Pulse 81 Comment: Simultaneous filing. User may not have seen previous data.  Temp 98.3 F (36.8 C) (Oral)   Resp 18 Comment: Simultaneous filing. User may not have seen previous data.  Ht 5\' 2"  (1.575 m)   Wt 49.9 kg   LMP 09/25/2017   SpO2 100% Comment: Simultaneous filing. User may not have seen previous data.  BMI 20.12 kg/m   Physical Exam  Constitutional: She is oriented to person, place, and time. She appears well-developed and well-nourished. She does not appear ill. She appears distressed (anxious).  HENT:  Head: Normocephalic and atraumatic.  Eyes: Pupils are equal, round, and reactive to light. Conjunctivae and EOM are normal.  Neck: Normal range of motion and phonation normal. Neck supple.  Cardiovascular: Normal rate and regular rhythm.  Pulmonary/Chest: Effort normal and breath sounds normal. She exhibits no tenderness.  Abdominal: Soft. She exhibits no distension. There is no tenderness. There is no guarding.  Musculoskeletal: Normal range of motion.  Neurological: She is alert and oriented to person, place, and time. She exhibits normal muscle tone.  Skin: Skin is warm and dry.  Psychiatric: She has a normal mood and affect. Her behavior is normal. Judgment and thought content normal.  Nursing note and vitals reviewed.    ED Treatments / Results  Labs (all labs ordered are listed, but only abnormal results are displayed) Labs Reviewed  BASIC METABOLIC PANEL - Abnormal; Notable for the following components:      Result Value   Glucose, Bld 112 (*)    All other components within normal limits  TSH  CBC  I-STAT TROPONIN, ED  I-STAT BETA HCG BLOOD, ED ([facility], [facility], [facility] ONLY)    EKG EKG Interpretation  Date/Time:  Monday October 18 2017 09:59:00 EDT Ventricular Rate:  97 PR Interval:    QRS Duration: 91 QT Interval:  344 QTC Calculation: 437 R Axis:   85 Text Interpretation:  Sinus rhythm Borderline  short PR interval Borderline T wave abnormalities Baseline wander in lead(s) V6 No old tracing to compare Confirmed by Mancel Bale [phone]) on 10/18/2017 10:44:59 AM   Radiology Dg Chest 2 View  Result Date: 10/18/2017 CLINICAL DATA:  Chest pain EXAM: CHEST - 2 VIEW COMPARISON:  None. FINDINGS: Heart and mediastinal contours are within normal limits. No focal opacities or effusions. No acute bony abnormality. Nodular symmetric densities projecting over both lower lungs felt represent nipple shadows. IMPRESSION: No active cardiopulmonary disease. Electronically Signed   By: Charlett Nose M.D.   On: 10/18/2017 10:42    Procedures Procedures (including critical care time)  Medications Ordered in ED Medications - No data  to display   Initial Impression / Assessment and Plan / ED Course  I have reviewed the triage vital signs and the nursing notes.  Pertinent labs & imaging results that were available during my care of the patient were reviewed by me and considered in my medical decision making (see chart for details).      Patient Vitals for the past 24 hrs:  BP Temp Temp src Pulse Resp SpO2 Height Weight  10/18/17 1156 109/71 - - 81 18 100 % - -  10/18/17 1007 - - - - - - 5\' 2"  (1.575 m) 49.9 kg  10/18/17 0959 115/74 98.3 F (36.8 C) Oral 94 15 100 % - -    At D/C- Reevaluation with update and discussion. After initial assessment and treatment, an updated evaluation reveals she remains comfortable.  Discussed the findings with the patient, and her husband, all questions were answered. Mancel Bale   Medical Decision Making: Nonspecific palpitation, with sensation of rapid heartbeat.  Suspect anxiety, situational related.  Doubt acute anxiety disorder.  Patient has a supportive environment, and does not require medication or hospitalization at this time.  CRITICAL CARE-no Performed by: Mancel Bale  Nursing Notes Reviewed/ Care Coordinated Applicable Imaging  Reviewed Interpretation of Laboratory Data incorporated into ED treatment  The patient appears reasonably screened and/or stabilized for discharge and I doubt any other medical condition or other [facility] requiring further screening, evaluation, or treatment in the ED at this time prior to discharge.  Plan: Home Medications-continue usual medications; Home Treatments-rest, sleep regularly; return here if the recommended treatment, does not improve the symptoms; Recommended follow up-PCP follow-up 1 week and as needed   Final Clinical Impressions(s) / ED Diagnoses   Final diagnoses:  [facility]    ED Discharge Orders    None       Mancel Bale, MD 10/18/17 2008

## 2017-10-18 NOTE — ED Notes (Signed)
CBC need to be recollect

## 2017-10-18 NOTE — ED Triage Notes (Signed)
Patient c/o constant chest pain x 4 days and "at times she has a fast heart beat and at times feels like a weight on her chest." Patient states she took a Claritin D and afterwards noticed the "fast heart beat."

## 2017-10-18 NOTE — Discharge Instructions (Addendum)
Testing today did not show any problems with heart, lungs or blood.  It is important to get plenty of rest, sleep 7 or 8 hours each day, and eat 3 meals.  Try to get some daily exercise and make sure you are taking time for yourself.  Consider seeing a primary care doctor for checkup in 1 or 2 months if you are not improving.  Your gynecologist can probably help you as well.

## 2018-01-17 DIAGNOSIS — Z1231 Encounter for screening mammogram for malignant neoplasm of breast: Secondary | ICD-10-CM | POA: Diagnosis not present

## 2018-11-10 DIAGNOSIS — Z0000 Encounter for general adult medical examination without abnormal findings: Secondary | ICD-10-CM | POA: Diagnosis not present

## 2018-11-10 DIAGNOSIS — D229 Melanocytic nevi, unspecified: Secondary | ICD-10-CM | POA: Diagnosis not present

## 2018-11-10 DIAGNOSIS — Z1283 Encounter for screening for malignant neoplasm of skin: Secondary | ICD-10-CM | POA: Diagnosis not present

## 2022-06-24 ENCOUNTER — Other Ambulatory Visit: Payer: Self-pay | Admitting: Nurse Practitioner

## 2022-06-24 DIAGNOSIS — R160 Hepatomegaly, not elsewhere classified: Secondary | ICD-10-CM

## 2022-07-16 ENCOUNTER — Other Ambulatory Visit: Payer: BLUE CROSS/BLUE SHIELD

## 2022-07-16 DIAGNOSIS — R160 Hepatomegaly, not elsewhere classified: Secondary | ICD-10-CM

## 2022-07-24 ENCOUNTER — Other Ambulatory Visit (HOSPITAL_COMMUNITY): Payer: Self-pay | Admitting: Nurse Practitioner

## 2022-07-24 DIAGNOSIS — R160 Hepatomegaly, not elsewhere classified: Secondary | ICD-10-CM

## 2022-08-03 ENCOUNTER — Other Ambulatory Visit (HOSPITAL_BASED_OUTPATIENT_CLINIC_OR_DEPARTMENT_OTHER): Payer: BLUE CROSS/BLUE SHIELD

## 2022-08-03 ENCOUNTER — Encounter (HOSPITAL_BASED_OUTPATIENT_CLINIC_OR_DEPARTMENT_OTHER): Payer: Self-pay
